# Patient Record
Sex: Female | Born: 1975 | Race: White | Hispanic: No | Marital: Married | Smoking: Never smoker
Health system: Southern US, Community
[De-identification: ages and names within clinical notes are randomized; demographics above are authoritative.]

## PROBLEM LIST (undated history)

## (undated) DIAGNOSIS — G43909 Migraine, unspecified, not intractable, without status migrainosus: Secondary | ICD-10-CM

## (undated) DIAGNOSIS — K589 Irritable bowel syndrome without diarrhea: Secondary | ICD-10-CM

## (undated) HISTORY — PX: OVARIAN CYST REMOVAL: SHX89

## (undated) HISTORY — PX: OTHER SURGICAL HISTORY: SHX169

---

## 2006-11-10 ENCOUNTER — Emergency Department: Payer: Self-pay | Admitting: Emergency Medicine

## 2008-11-26 ENCOUNTER — Emergency Department: Payer: Self-pay | Admitting: Unknown Physician Specialty

## 2011-06-14 ENCOUNTER — Emergency Department: Payer: Self-pay | Admitting: Emergency Medicine

## 2015-02-07 ENCOUNTER — Ambulatory Visit: Admission: EM | Admit: 2015-02-07 | Discharge: 2015-02-07 | Discharge status: Absent without leave | Payer: Self-pay

## 2015-06-20 ENCOUNTER — Other Ambulatory Visit: Payer: Self-pay | Admitting: Obstetrics and Gynecology

## 2015-06-20 DIAGNOSIS — Z1231 Encounter for screening mammogram for malignant neoplasm of breast: Secondary | ICD-10-CM

## 2015-06-29 ENCOUNTER — Ambulatory Visit
Admission: RE | Admit: 2015-06-29 | Discharge: 2015-06-29 | Disposition: A | Discharge status: Home / Self Care | Payer: 59 | Source: Ambulatory Visit | Attending: Obstetrics and Gynecology | Admitting: Obstetrics and Gynecology

## 2015-06-29 DIAGNOSIS — Z1231 Encounter for screening mammogram for malignant neoplasm of breast: Secondary | ICD-10-CM | POA: Insufficient documentation

## 2016-07-01 ENCOUNTER — Other Ambulatory Visit: Payer: Self-pay | Admitting: Obstetrics and Gynecology

## 2016-07-01 DIAGNOSIS — Z1231 Encounter for screening mammogram for malignant neoplasm of breast: Secondary | ICD-10-CM

## 2016-07-15 ENCOUNTER — Ambulatory Visit
Admission: RE | Admit: 2016-07-15 | Discharge: 2016-07-15 | Disposition: A | Discharge status: Home / Self Care | Payer: 59 | Source: Ambulatory Visit | Attending: Obstetrics and Gynecology | Admitting: Obstetrics and Gynecology

## 2016-07-15 DIAGNOSIS — Z1231 Encounter for screening mammogram for malignant neoplasm of breast: Secondary | ICD-10-CM | POA: Diagnosis not present

## 2017-06-02 ENCOUNTER — Encounter: Payer: Self-pay | Admitting: *Deleted

## 2017-06-02 ENCOUNTER — Ambulatory Visit
Admission: EM | Admit: 2017-06-02 | Discharge: 2017-06-02 | Disposition: A | Discharge status: Home / Self Care | Payer: 59 | Attending: Family Medicine | Admitting: Family Medicine

## 2017-06-02 DIAGNOSIS — B9689 Other specified bacterial agents as the cause of diseases classified elsewhere: Secondary | ICD-10-CM

## 2017-06-02 DIAGNOSIS — N390 Urinary tract infection, site not specified: Secondary | ICD-10-CM | POA: Diagnosis not present

## 2017-06-02 DIAGNOSIS — N760 Acute vaginitis: Secondary | ICD-10-CM

## 2017-06-02 DIAGNOSIS — L237 Allergic contact dermatitis due to plants, except food: Secondary | ICD-10-CM

## 2017-06-02 HISTORY — DX: Irritable bowel syndrome, unspecified: K58.9

## 2017-06-02 HISTORY — DX: Migraine, unspecified, not intractable, without status migrainosus: G43.909

## 2017-06-02 LAB — URINALYSIS, COMPLETE (UACMP) WITH MICROSCOPIC
Bilirubin Urine: NEGATIVE
Glucose, UA: NEGATIVE mg/dL
Ketones, ur: NEGATIVE mg/dL
Nitrite: POSITIVE — AB
PH: 7 (ref 5.0–8.0)
SPECIFIC GRAVITY, URINE: 1.02 (ref 1.005–1.030)

## 2017-06-02 LAB — WET PREP, GENITAL
SPERM: NONE SEEN
Trich, Wet Prep: NONE SEEN
YEAST WET PREP: NONE SEEN

## 2017-06-02 LAB — PREGNANCY, URINE: Preg Test, Ur: NEGATIVE

## 2017-06-02 MED ORDER — METRONIDAZOLE 500 MG PO TABS: 500.0000 mg | ORAL_TABLET | Freq: Two times a day (BID) | ORAL | 0 refills | Status: DC

## 2017-06-02 MED ORDER — CEPHALEXIN 500 MG PO CAPS: 500.0000 mg | ORAL_CAPSULE | Freq: Two times a day (BID) | ORAL | 0 refills | Status: DC

## 2017-06-02 MED ORDER — TRIAMCINOLONE ACETONIDE 0.1 % EX CREA: 1.0000 "application " | TOPICAL_CREAM | Freq: Two times a day (BID) | CUTANEOUS | 0 refills | Status: DC

## 2017-06-02 NOTE — ED Provider Notes (Signed)
[facility]    CSN: [id number] Arrival date & time: 06/02/17  1412     History   Chief Complaint Chief Complaint  Patient presents with  . Vaginal Discharge    HPI Amanda Miranda is a 41 y.o. female.   HPI  41 year old female who presents with symptoms of low back pain does not radiate into her flank and nausea for 2 weeks. She states that her urine has been very dark and smelly. She states that she has a history of vaginitis in the past. Whenever her urine smells she has  been found to have  vaginitis. She denies any discharge. She's had no pain. Denies any fever or chills. She denies any urgency frequency or dysuria. She states that last week she had her yearly exam which included chlamydia and gonorrhea which  were negative.  Patient also has lesions on her forearms and one on the left malar region of her face from poison ivy contact. There are linear erythematous and have vesicles present.        Past Medical History:  Diagnosis Date  . IBS (irritable bowel syndrome)   . Migraines     There are no active problems to display for this patient.   Past Surgical History:  Procedure Laterality Date  . neck surgery    . OVARIAN CYST REMOVAL      OB History    No data available       Home Medications    Prior to Admission medications   Medication Sig Start Date End Date Taking? Authorizing Provider  Butalbital-ASA-Caff-Codeine (BUTAL-ASA-CAFF-COD PO) Take by mouth.   Yes [provider]  cyclobenzaprine (FLEXERIL) 10 MG tablet Take 10 mg by mouth 3 (three) times daily as needed for muscle spasms.   Yes [provider]  norethindrone-ethinyl estradiol-iron (ESTROSTEP FE,TILIA FE,TRI-LEGEST FE) 1-20/1-30/1-35 MG-MCG tablet Take 1 tablet by mouth daily.   Yes [provider]  promethazine (PHENERGAN) 25 MG tablet Take 25 mg by mouth every 6 (six) hours as needed for nausea or vomiting.   Yes [provider]  rizatriptan  (MAXALT) 5 MG tablet Take 5 mg by mouth as needed for migraine. May repeat in 2 hours if needed   Yes [provider]  cephALEXin (KEFLEX) 500 MG capsule Take 1 capsule (500 mg total) by mouth 2 (two) times daily. 06/02/17   Lorin Picket, PA-C  metroNIDAZOLE (FLAGYL) 500 MG tablet Take 1 tablet (500 mg total) by mouth 2 (two) times daily. 06/02/17   Lorin Picket, PA-C  triamcinolone cream (KENALOG) 0.1 % Apply 1 application topically 2 (two) times daily. 06/02/17   Lorin Picket, PA-C    Family History Family History  Problem Relation Age of Onset  . Hypertension Mother   . COPD Father   . Breast cancer Neg Hx     Social History Social History  Substance Use Topics  . Smoking status: Never Smoker  . Smokeless tobacco: Never Used  . Alcohol use Yes     Allergies   Patient has no known allergies.   Review of Systems Review of Systems  Constitutional: Positive for activity change. Negative for appetite change, chills, fatigue and fever.  Genitourinary: Negative for flank pain, genital sores, hematuria, vaginal discharge and vaginal pain.  All other systems reviewed and are negative.    Physical Exam Triage Vital Signs ED Triage Vitals  Enc Vitals Group     BP 06/02/17 1441 114/79  Pulse Rate 06/02/17 1441 72     Resp 06/02/17 1441 16     Temp 06/02/17 1441 98.9 F (37.2 C)     Temp Source 06/02/17 1441 Oral     SpO2 06/02/17 1441 100 %     Weight 06/02/17 1441 124 lb (56.2 kg)     Height 06/02/17 1441 5\' 6"  (1.676 m)     Head Circumference --      Peak Flow --      Pain Score 06/02/17 1442 0     Pain Loc --      Pain Edu? --      Excl. in [place]? --    No data found.   Updated Vital Signs BP 114/79 (BP Location: Left Arm)   Pulse 72   Temp 98.9 F (37.2 C) (Oral)   Resp 16   Ht 5\' 6"  (1.676 m)   Wt 124 lb (56.2 kg)   LMP 04/16/2017   SpO2 100%   BMI 20.01 kg/m   Visual Acuity Right Eye Distance:   Left Eye Distance:   Bilateral  Distance:    Right Eye Near:   Left Eye Near:    Bilateral Near:     Physical Exam  Constitutional: She is oriented to person, place, and time. She appears well-developed and well-nourished. No distress.  HENT:  Head: Normocephalic.  Eyes: Pupils are equal, round, and reactive to light. Right eye exhibits no discharge. Left eye exhibits no discharge.  Neck: Normal range of motion.  Pulmonary/Chest: Effort normal and breath sounds normal.  Abdominal: Soft. Bowel sounds are normal.  No significant CVA tenderness.  Genitourinary: Vaginal discharge found.  Genitourinary Comments: Pelvic exam was performed with Myrna, CMA chaperone and assisted. External genitalia shows no discharge no lesions no irritation seen. Speculum exam was then performed showing an adherent whitish discharge over the walls of the vagina. A positive whiff test. Samples were taken and specimen to laboratory for analysis. Speculum was removed. Bimanual was then performed. Patient has adnexal tenderness on the right and less so on the left. There is no cervical motion tenderness present.  Musculoskeletal: She exhibits tenderness.  Neurological: She is alert and oriented to person, place, and time.  Skin: Skin is warm and dry. She is not diaphoretic.  Psychiatric: She has a normal mood and affect. Her behavior is normal. Judgment and thought content normal.  Nursing note and vitals reviewed.    UC Treatments / Results  Labs (all labs ordered are listed, but only abnormal results are displayed) Labs Reviewed  WET PREP, GENITAL - Abnormal; Notable for the following:       Result Value   Clue Cells Wet Prep HPF POC PRESENT (*)    WBC, Wet Prep HPF POC FEW (*)    All other components within normal limits  URINALYSIS, COMPLETE (UACMP) WITH MICROSCOPIC - Abnormal; Notable for the following:    APPearance HAZY (*)    Hgb urine dipstick SMALL (*)    Protein, ur TRACE (*)    Nitrite POSITIVE (*)    Leukocytes, UA TRACE  (*)    Squamous Epithelial / LPF 0-5 (*)    Bacteria, UA FEW (*)    All other components within normal limits  URINE CULTURE  PREGNANCY, URINE    EKG  EKG Interpretation None       Radiology No results found.  Procedures Procedures (including critical care time)  Medications Ordered in UC Medications - No data to display  Initial Impression / Assessment and Plan / UC Course  I have reviewed the triage vital signs and the nursing notes.  Pertinent labs & imaging results that were available during my care of the patient were reviewed by me and considered in my medical decision making (see chart for details).     Plan: 1. Test/x-ray results and diagnosis reviewed with patient 2. rx as per orders; risks, benefits, potential side effects reviewed with patient 3. Recommend supportive treatment with no  alcohol use while taking the Flagyl. We'll treat for a suspected UTI and culture urine for confirmation. For the rash on her arms that are poison ivy I will provide her with triamcinolone cream. She is one small area over her left malar region of her face that she will use also.Recommended that she follow-up with her GYN due to her right adnexal tenderness and her back pain. 4. F/u prn if symptoms worsen or don't improve   Final Clinical Impressions(s) / UC Diagnoses   Final diagnoses:  BV (bacterial vaginosis)  Lower urinary tract infectious disease  Poison ivy dermatitis    New Prescriptions Discharge Medication List as of 06/02/2017  4:15 PM    START taking these medications   Details  cephALEXin (KEFLEX) 500 MG capsule Take 1 capsule (500 mg total) by mouth 2 (two) times daily., Starting Mon 06/02/2017, Normal    metroNIDAZOLE (FLAGYL) 500 MG tablet Take 1 tablet (500 mg total) by mouth 2 (two) times daily., Starting Mon 06/02/2017, Normal    triamcinolone cream (KENALOG) 0.1 % Apply 1 application topically 2 (two) times daily., Starting Mon 06/02/2017, Normal           Controlled Substance Prescriptions  Controlled Substance Registry consulted? Not Applicable   Lorin Picket, PA-C 06/02/17 2120

## 2017-06-02 NOTE — ED Triage Notes (Signed)
Patient started having symptoms of vaginal discharge / discomfort, lower back pain, and nausea 2 weeks ago. Patient has a history of vaginitis.

## 2017-06-05 LAB — URINE CULTURE: Culture: >=100000 — AB

## 2017-06-06 ENCOUNTER — Telehealth: Payer: Self-pay | Admitting: Emergency Medicine

## 2017-06-06 NOTE — Telephone Encounter (Signed)
Called patient regarding positive Urine culture results. Results communicated, sensitivities discussed. Patient voiced understanding of need to finish antibiotic and follow-up if symptoms do not continue to improve. nmw

## 2017-06-11 DIAGNOSIS — G43909 Migraine, unspecified, not intractable, without status migrainosus: Secondary | ICD-10-CM | POA: Insufficient documentation

## 2017-07-02 ENCOUNTER — Ambulatory Visit
Admission: EM | Admit: 2017-07-02 | Discharge: 2017-07-02 | Disposition: A | Discharge status: Home / Self Care | Payer: 59 | Attending: Family Medicine | Admitting: Family Medicine

## 2017-07-02 DIAGNOSIS — N898 Other specified noninflammatory disorders of vagina: Secondary | ICD-10-CM

## 2017-07-02 DIAGNOSIS — R319 Hematuria, unspecified: Secondary | ICD-10-CM

## 2017-07-02 DIAGNOSIS — R300 Dysuria: Secondary | ICD-10-CM

## 2017-07-02 DIAGNOSIS — N390 Urinary tract infection, site not specified: Secondary | ICD-10-CM

## 2017-07-02 DIAGNOSIS — B373 Candidiasis of vulva and vagina: Secondary | ICD-10-CM | POA: Diagnosis not present

## 2017-07-02 DIAGNOSIS — Z3202 Encounter for pregnancy test, result negative: Secondary | ICD-10-CM | POA: Diagnosis not present

## 2017-07-02 DIAGNOSIS — B3731 Acute candidiasis of vulva and vagina: Secondary | ICD-10-CM

## 2017-07-02 LAB — URINALYSIS, COMPLETE (UACMP) WITH MICROSCOPIC
Bilirubin Urine: NEGATIVE
Glucose, UA: NEGATIVE mg/dL
Ketones, ur: NEGATIVE mg/dL
Nitrite: POSITIVE — AB
PH: 7 (ref 5.0–8.0)
Protein, ur: NEGATIVE mg/dL
SPECIFIC GRAVITY, URINE: 1.015 (ref 1.005–1.030)

## 2017-07-02 LAB — WET PREP, GENITAL
Clue Cells Wet Prep HPF POC: NONE SEEN
SPERM: NONE SEEN
TRICH WET PREP: NONE SEEN

## 2017-07-02 LAB — PREGNANCY, URINE: Preg Test, Ur: NEGATIVE

## 2017-07-02 MED ORDER — FLUCONAZOLE 150 MG PO TABS: 150.0000 mg | ORAL_TABLET | Freq: Every day | ORAL | 0 refills | Status: DC

## 2017-07-02 MED ORDER — CIPROFLOXACIN HCL 500 MG PO TABS: 500.0000 mg | ORAL_TABLET | Freq: Two times a day (BID) | ORAL | 0 refills | Status: DC

## 2017-07-02 NOTE — ED Provider Notes (Signed)
[facility]    CSN: [id number] Arrival date & time: 07/02/17  1726     History   Chief Complaint Chief Complaint  Patient presents with  . Dysuria    HPI Amanda Miranda is a 41 y.o. female.    Dysuria  Pain quality:  Burning Pain severity:  Mild Onset quality:  Sudden Duration:  1 week Timing:  Constant Progression:  Worsening Chronicity:  New Recent urinary tract infections: yes   Relieved by:  Nothing Ineffective treatments:  Antibiotics Urinary symptoms: foul-smelling urine and frequent urination   Urinary symptoms: no hematuria and no bladder incontinence   Associated symptoms: nausea and vaginal discharge   Associated symptoms: no abdominal pain, no fever, no flank pain, no genital lesions and no vomiting   Risk factors: sexually active   Risk factors: no hx of pyelonephritis, no hx of urolithiasis, no kidney transplant, not pregnant, no recurrent urinary tract infections, no renal cysts, no renal disease, no sexually transmitted infections, no single kidney and no urinary catheter     Past Medical History:  Diagnosis Date  . IBS (irritable bowel syndrome)   . Migraines     There are no active problems to display for this patient.   Past Surgical History:  Procedure Laterality Date  . neck surgery    . OVARIAN CYST REMOVAL      OB History    No data available       Home Medications    Prior to Admission medications   Medication Sig Start Date End Date Taking? Authorizing Provider  Butalbital-ASA-Caff-Codeine (BUTAL-ASA-CAFF-COD PO) Take by mouth.   Yes [provider]  cyclobenzaprine (FLEXERIL) 10 MG tablet Take 10 mg by mouth 3 (three) times daily as needed for muscle spasms.   Yes [provider]  norethindrone-ethinyl estradiol-iron (ESTROSTEP FE,TILIA FE,TRI-LEGEST FE) 1-20/1-30/1-35 MG-MCG tablet Take 1 tablet by mouth daily.   Yes [provider]  promethazine (PHENERGAN) 25 MG tablet Take 25 mg by  mouth every 6 (six) hours as needed for nausea or vomiting.   Yes [provider]  rizatriptan (MAXALT) 5 MG tablet Take 5 mg by mouth as needed for migraine. May repeat in 2 hours if needed   Yes [provider]  triamcinolone cream (KENALOG) 0.1 % Apply 1 application topically 2 (two) times daily. 06/02/17  Yes Lorin Picket, PA-C  cephALEXin (KEFLEX) 500 MG capsule Take 1 capsule (500 mg total) by mouth 2 (two) times daily. 06/02/17   Lorin Picket, PA-C  ciprofloxacin (CIPRO) 500 MG tablet Take 1 tablet (500 mg total) by mouth every 12 (twelve) hours. 07/02/17   Norval Gable, MD  fluconazole (DIFLUCAN) 150 MG tablet Take 1 tablet (150 mg total) by mouth daily. 07/02/17   Norval Gable, MD  metroNIDAZOLE (FLAGYL) 500 MG tablet Take 1 tablet (500 mg total) by mouth 2 (two) times daily. 06/02/17   Lorin Picket, PA-C    Family History Family History  Problem Relation Age of Onset  . Hypertension Mother   . COPD Father   . Breast cancer Neg Hx     Social History Social History  Substance Use Topics  . Smoking status: Never Smoker  . Smokeless tobacco: Never Used  . Alcohol use Yes     Allergies   Patient has no known allergies.   Review of Systems Review of Systems  Constitutional: Negative for fever.  Gastrointestinal: Positive for nausea. Negative for abdominal pain and vomiting.  Genitourinary:  Positive for dysuria and vaginal discharge. Negative for flank pain.     Physical Exam Triage Vital Signs ED Triage Vitals  Enc Vitals Group     BP 07/02/17 1739 118/65     Pulse Rate 07/02/17 1739 73     Resp 07/02/17 1739 18     Temp 07/02/17 1739 98.2 F (36.8 C)     Temp Source 07/02/17 1739 Oral     SpO2 07/02/17 1739 100 %     Weight 07/02/17 1738 124 lb (56.2 kg)     Height 07/02/17 1738 5\' 6"  (1.676 m)     Head Circumference --      Peak Flow --      Pain Score 07/02/17 1738 6     Pain Loc --      Pain Edu? --      Excl. in [place]? --     No data found.   Updated Vital Signs BP 118/65 (BP Location: Left Arm)   Pulse 73   Temp 98.2 F (36.8 C) (Oral)   Resp 18   Ht 5\' 6"  (1.676 m)   Wt 124 lb (56.2 kg)   SpO2 100%   BMI 20.01 kg/m   Visual Acuity Right Eye Distance:   Left Eye Distance:   Bilateral Distance:    Right Eye Near:   Left Eye Near:    Bilateral Near:     Physical Exam  Constitutional: She appears well-developed and well-nourished. No distress.  Abdominal: Soft. Bowel sounds are normal. She exhibits no distension and no mass. There is tenderness (mild, suprapubic). There is no rebound and no guarding.  Skin: She is not diaphoretic.  Nursing note and vitals reviewed.    UC Treatments / Results  Labs (all labs ordered are listed, but only abnormal results are displayed) Labs Reviewed  WET PREP, GENITAL - Abnormal; Notable for the following:       Result Value   Yeast Wet Prep HPF POC PRESENT (*)    WBC, Wet Prep HPF POC FEW (*)    All other components within normal limits  URINALYSIS, COMPLETE (UACMP) WITH MICROSCOPIC - Abnormal; Notable for the following:    APPearance CLOUDY (*)    Hgb urine dipstick TRACE (*)    Nitrite POSITIVE (*)    Leukocytes, UA SMALL (*)    Squamous Epithelial / LPF 0-5 (*)    Bacteria, UA FEW (*)    All other components within normal limits  URINE CULTURE  PREGNANCY, URINE    EKG  EKG Interpretation None       Radiology No results found.  Procedures Procedures (including critical care time)  Medications Ordered in UC Medications - No data to display   Initial Impression / Assessment and Plan / UC Course  I have reviewed the triage vital signs and the nursing notes.  Pertinent labs & imaging results that were available during my care of the patient were reviewed by me and considered in my medical decision making (see chart for details).       Final Clinical Impressions(s) / UC Diagnoses   Final diagnoses:  Urinary tract infection  with hematuria, site unspecified  Yeast vaginitis    New Prescriptions Discharge Medication List as of 07/02/2017  6:37 PM    START taking these medications   Details  ciprofloxacin (CIPRO) 500 MG tablet Take 1 tablet (500 mg total) by mouth every 12 (twelve) hours., Starting Wed 07/02/2017, Normal    fluconazole (DIFLUCAN) 150  MG tablet Take 1 tablet (150 mg total) by mouth daily., Starting Wed 07/02/2017, Normal       1. Lab results and diagnosis reviewed with patient 2. rx as per orders above; reviewed possible side effects, interactions, risks and benefits  3. Recommend supportive treatment with increase water intake; otc analgesics 4. Follow-up prn if symptoms worsen or don't improve  Controlled Substance Prescriptions [place] Controlled Substance Registry consulted? Not Applicable   Norval Gable, MD 07/02/17 [phone]

## 2017-07-02 NOTE — ED Triage Notes (Signed)
Patient complains of urinary urgency, frequency and burning with urination. Patient was seen here one month ago and was placed on keflex. Patient states that symptoms never fully went away. Patient reports that symptoms worsened again recently.

## 2017-07-05 LAB — URINE CULTURE

## 2017-10-31 ENCOUNTER — Encounter: Payer: Self-pay | Admitting: Emergency Medicine

## 2017-10-31 ENCOUNTER — Other Ambulatory Visit: Payer: Self-pay

## 2017-10-31 ENCOUNTER — Emergency Department
Admission: EM | Admit: 2017-10-31 | Discharge: 2017-10-31 | Disposition: A | Discharge status: Home / Self Care | Payer: Managed Care, Other (non HMO) | Attending: Emergency Medicine | Admitting: Emergency Medicine

## 2017-10-31 DIAGNOSIS — T783XXA Angioneurotic edema, initial encounter: Secondary | ICD-10-CM | POA: Diagnosis not present

## 2017-10-31 DIAGNOSIS — Z79899 Other long term (current) drug therapy: Secondary | ICD-10-CM | POA: Diagnosis not present

## 2017-10-31 DIAGNOSIS — R600 Localized edema: Secondary | ICD-10-CM | POA: Diagnosis present

## 2017-10-31 DIAGNOSIS — T7840XA Allergy, unspecified, initial encounter: Secondary | ICD-10-CM

## 2017-10-31 LAB — CBC
HCT: 44.5 % (ref 35.0–47.0)
Hemoglobin: 15.2 g/dL (ref 12.0–16.0)
MCH: 33 pg (ref 26.0–34.0)
MCHC: 34.1 g/dL (ref 32.0–36.0)
MCV: 96.7 fL (ref 80.0–100.0)
PLATELETS: 146 10*3/uL — AB (ref 150–440)
RBC: 4.61 MIL/uL (ref 3.80–5.20)
RDW: 12.3 % (ref 11.5–14.5)
WBC: 4.5 10*3/uL (ref 3.6–11.0)

## 2017-10-31 LAB — BASIC METABOLIC PANEL
Anion gap: 9 (ref 5–15)
BUN: 12 mg/dL (ref 6–20)
CALCIUM: 9.2 mg/dL (ref 8.9–10.3)
CO2: 21 mmol/L — ABNORMAL LOW (ref 22–32)
Chloride: 108 mmol/L (ref 101–111)
Creatinine, Ser: 0.6 mg/dL (ref 0.44–1.00)
GFR calc Af Amer: >60 mL/min (ref >60)
Glucose, Bld: 90 mg/dL (ref 65–99)
Potassium: 4.8 mmol/L (ref 3.5–5.1)
SODIUM: 138 mmol/L (ref 135–145)

## 2017-10-31 MED ORDER — PREDNISONE 10 MG PO TABS: ORAL_TABLET | ORAL | 0 refills | Status: DC

## 2017-10-31 MED ORDER — AMOXICILLIN 500 MG PO CAPS: 500.0000 mg | ORAL_CAPSULE | Freq: Three times a day (TID) | ORAL | 0 refills | Status: DC

## 2017-10-31 MED ORDER — EPINEPHRINE 0.3 MG/0.3ML IJ SOAJ: 0.3000 mg | Freq: Once | INTRAMUSCULAR | 1 refills | Status: AC

## 2017-10-31 MED ORDER — FAMOTIDINE IN NACL 20-0.9 MG/50ML-% IV SOLN
20.0000 mg | Freq: Once | INTRAVENOUS | Status: AC
  Administered 2017-10-31: 20 mg via INTRAVENOUS
  Filled 2017-10-31: qty 50

## 2017-10-31 MED ORDER — DIPHENHYDRAMINE HCL 50 MG/ML IJ SOLN
25.0000 mg | Freq: Once | INTRAMUSCULAR | Status: AC
  Administered 2017-10-31: 25 mg via INTRAVENOUS
  Filled 2017-10-31: qty 1

## 2017-10-31 MED ORDER — RANITIDINE HCL 150 MG PO TABS: 150.0000 mg | ORAL_TABLET | Freq: Two times a day (BID) | ORAL | 0 refills | Status: DC

## 2017-10-31 MED ORDER — METHYLPREDNISOLONE SODIUM SUCC 125 MG IJ SOLR
125.0000 mg | Freq: Once | INTRAMUSCULAR | Status: AC
  Administered 2017-10-31: 125 mg via INTRAVENOUS
  Filled 2017-10-31: qty 2

## 2017-10-31 MED ORDER — DIPHENHYDRAMINE HCL 25 MG PO CAPS: 25.0000 mg | ORAL_CAPSULE | ORAL | 0 refills | Status: DC | PRN

## 2017-10-31 NOTE — Discharge Instructions (Addendum)
Return to the ED immediately for throat swelling or SOB. Continue taking Benadryl every 4-6 hours. Take amoxicillin, ranididine tonight. Begin steroids tomorrow. Follow up with primary care, allergy, and dentist next week.

## 2017-10-31 NOTE — ED Triage Notes (Signed)
Pt to ed with c/o facial swelling to mouth and jaws.  Pt denies sob, denies difficulty breathing.  Pt states she has an ulcer in mouth at this time.

## 2017-10-31 NOTE — ED Notes (Signed)
Pt given diet shasta and graham crackers

## 2017-10-31 NOTE — ED Notes (Signed)
See triage note  Presents with swelling to both jaw areas  States she woke up with this swelling this am and also swelling noted to lips  Denies any new meds or foods

## 2017-10-31 NOTE — ED Provider Notes (Signed)
[facility] Emergency Department Provider Note  ____________________________________________  Time seen: Approximately 11:08 AM  I have reviewed the triage vital signs and the nursing notes.   HISTORY  Chief Complaint Facial Swelling    HPI Amanda Miranda is a 42 y.o. female that presents to the emergency department for evaluation of right cheek and lip swelling and itchy rash since this morning.  Patient has frequent mouth ulcers and has one on the right side of her cheek.  Patient called her dentist this morning and asked if the ulcer could cause swelling and was told to come to the ER.  She took ibuprofen yesterday.  She has one more day left of  a course of antibiotics for a UTI.  The only other medication she occasionally takes is Maxalt for migraines.   She has no known allergies.  She sees the dentist twice a year.  No fever, throat tightening, dental pain, shortness of breath, chest pain, nausea, vomiting, abdominal pain, dysuria, urgency, frequency.   Past Medical History:  Diagnosis Date  . IBS (irritable bowel syndrome)   . Migraines     There are no active problems to display for this patient.   Past Surgical History:  Procedure Laterality Date  . neck surgery    . OVARIAN CYST REMOVAL      Prior to Admission medications   Medication Sig Start Date End Date Taking? Authorizing Provider  nitrofurantoin (MACRODANTIN) 100 MG capsule Take 100 mg by mouth 4 (four) times daily.   Yes [provider]  amoxicillin (AMOXIL) 500 MG capsule Take 1 capsule (500 mg total) by mouth 3 (three) times daily. 10/31/17   Laban Emperor, PA-C  Butalbital-ASA-Caff-Codeine (BUTAL-ASA-CAFF-COD PO) Take by mouth.    [provider]  cephALEXin (KEFLEX) 500 MG capsule Take 1 capsule (500 mg total) by mouth 2 (two) times daily. 06/02/17   Lorin Picket, PA-C  ciprofloxacin (CIPRO) 500 MG tablet Take 1 tablet (500 mg total) by mouth every 12 (twelve)  hours. 07/02/17   Norval Gable, MD  cyclobenzaprine (FLEXERIL) 10 MG tablet Take 10 mg by mouth 3 (three) times daily as needed for muscle spasms.    [provider]  diphenhydrAMINE (BENADRYL) 25 mg capsule Take 1 capsule (25 mg total) by mouth every 4 (four) hours as needed. 10/31/17 10/31/18  Laban Emperor, PA-C  EPINEPHrine 0.3 mg/0.3 mL IJ SOAJ injection Inject 0.3 mLs (0.3 mg total) into the muscle once for 1 dose. 10/31/17 10/31/17  Laban Emperor, PA-C  fluconazole (DIFLUCAN) 150 MG tablet Take 1 tablet (150 mg total) by mouth daily. 07/02/17   Norval Gable, MD  metroNIDAZOLE (FLAGYL) 500 MG tablet Take 1 tablet (500 mg total) by mouth 2 (two) times daily. 06/02/17   Lorin Picket, PA-C  norethindrone-ethinyl estradiol-iron (ESTROSTEP FE,TILIA FE,TRI-LEGEST FE) 1-20/1-30/1-35 MG-MCG tablet Take 1 tablet by mouth daily.    [provider]  predniSONE (DELTASONE) 10 MG tablet Take 6 tablets on day 1, take 5 tablets on day 2, take 4 tablets on day 3, take 3 tablets on day 4, take 2 tablets on day 5, take 1 tablet on day 6 10/31/17   Laban Emperor, PA-C  promethazine (PHENERGAN) 25 MG tablet Take 25 mg by mouth every 6 (six) hours as needed for nausea or vomiting.    [provider]  ranitidine (ZANTAC) 150 MG tablet Take 1 tablet (150 mg total) by mouth 2 (two) times daily. 10/31/17 10/31/18  Laban Emperor, PA-C  rizatriptan (MAXALT) 5 MG tablet Take 5 mg by mouth as needed for migraine. May repeat in 2 hours if needed    [provider]  triamcinolone cream (KENALOG) 0.1 % Apply 1 application topically 2 (two) times daily. 06/02/17   Lorin Picket, PA-C    Allergies Patient has no known allergies.  Family History  Problem Relation Age of Onset  . Hypertension Mother   . COPD Father   . Breast cancer Neg Hx     Social History Social History   Tobacco Use  . Smoking status: Never Smoker  . Smokeless tobacco: Never Used  Substance Use Topics   . Alcohol use: Yes  . Drug use: No     Review of Systems  Constitutional: No fever/chills Cardiovascular: No chest pain. Respiratory: No SOB. Gastrointestinal: No abdominal pain.  No nausea, no vomiting.  Musculoskeletal: Negative for musculoskeletal pain. Skin: Negative for abrasions, lacerations, ecchymosis.   ____________________________________________   PHYSICAL EXAM:  VITAL SIGNS: ED Triage Vitals  Enc Vitals Group     BP 10/31/17 0839 124/87     Pulse Rate 10/31/17 0839 80     Resp 10/31/17 0839 20     Temp 10/31/17 0839 98.7 F (37.1 C)     Temp Source 10/31/17 0839 Oral     SpO2 10/31/17 0839 100 %     Weight 10/31/17 0840 125 lb (56.7 kg)     Height 10/31/17 0840 5\' 6"  (1.676 m)     Head Circumference --      Peak Flow --      Pain Score 10/31/17 0844 3     Pain Loc --      Pain Edu? --      Excl. in [place]? --      Constitutional: Alert and oriented. Well appearing and in no acute distress. Eyes: Conjunctivae are normal. PERRL. EOMI. Head: Atraumatic. ENT:      Ears:      Nose: No congestion/rhinnorhea.      Mouth/Throat: Mucous membranes are moist.  Swelling to right cheek and top lip.  1 mm ulcer to inside of right cheek.  Filling in bottom right molar.  Cavity and top right molar.  No pain around teeth.  No submandibular swelling. No palpable abscess.  Neck: No stridor.  Cardiovascular: Normal rate, regular rhythm.  Good peripheral circulation. Respiratory: Normal respiratory effort without tachypnea or retractions. Lungs CTAB. Good air entry to the bases with no decreased or absent breath sounds. Gastrointestinal: Bowel sounds 4 quadrants. Soft and nontender to palpation. No guarding or rigidity. No palpable masses. No distention.  Musculoskeletal: Full range of motion to all extremities. No gross deformities appreciated. Neurologic:  Normal speech and language. No gross focal neurologic deficits are appreciated.  Skin:  Skin is warm, dry and intact.   Hives to chest, abdomen, arms.   ____________________________________________   LABS (all labs ordered are listed, but only abnormal results are displayed)  Labs Reviewed  CBC - Abnormal; Notable for the following components:      Result Value   Platelets 146 (*)    All other components within normal limits  BASIC METABOLIC PANEL - Abnormal; Notable for the following components:   CO2 21 (*)    All other components within normal limits   ____________________________________________  EKG   ____________________________________________  RADIOLOGY   No results found.  ____________________________________________    PROCEDURES  Procedure(s) performed:    Procedures    Medications  methylPREDNISolone sodium  succinate (SOLU-MEDROL) 125 mg/2 mL injection 125 mg (125 mg Intravenous Given 10/31/17 1122)  diphenhydrAMINE (BENADRYL) injection 25 mg (25 mg Intravenous Given 10/31/17 1123)  famotidine (PEPCID) IVPB 20 mg premix (0 mg Intravenous Stopped 10/31/17 1349)     ____________________________________________   INITIAL IMPRESSION / ASSESSMENT AND PLAN / ED COURSE  Pertinent labs & imaging results that were available during my care of the patient were reviewed by me and considered in my medical decision making (see chart for details).  Review of the [place] CSRS was performed in accordance of the [place] prior to dispensing any controlled drugs.   Patient presented to the emergency department for evaluation of right cheek swelling and hives since this morning.  Symptoms are consistent with angioedema and allergic reaction.  Angioedema is likely allergic since patient also has hives.  She is unsure what she is allergic to.  She has 1 more day left of a course of antibiotics.  We discussed that this is unlikely the cause of her reaction but she will not take the last day of medication.  In the 5 hours that she has been in the emergency department she has not felt any throat  tightening or shortness of breath.  She was given IM Solu-Medrol, Benadryl, Pepcid.  She will be given a prescription for prednisone, Benadryl, ranitidine.  She will also be given a prescription for an EpiPen.  Pharmacies were called and was confirmed that epi pens are available at [facility].  Patient will take her prescription there.  She will also be given a course of amoxicillin since swelling is over ulcer to cover for bacterial infection.  Patient will be discharged home with prescriptions for prednisone, Benadryl, ranitidine, EpiPen, amoxicillin. Patient is to follow up with dentist, allergy, PCP, ER as directed. Patient is given ED precautions to return to the ED for any worsening or new symptoms.     ____________________________________________  FINAL CLINICAL IMPRESSION(S) / ED DIAGNOSES  Final diagnoses:  Angioedema, initial encounter  Allergic reaction, initial encounter      NEW MEDICATIONS STARTED DURING THIS VISIT:  ED Discharge Orders        Ordered    EPINEPHrine 0.3 mg/0.3 mL IJ SOAJ injection   Once     10/31/17 1313    predniSONE (DELTASONE) 10 MG tablet     10/31/17 1313    amoxicillin (AMOXIL) 500 MG capsule  3 times daily     10/31/17 1313    diphenhydrAMINE (BENADRYL) 25 mg capsule  Every 4 hours PRN     10/31/17 1313    ranitidine (ZANTAC) 150 MG tablet  2 times daily     10/31/17 1313          This chart was dictated using voice recognition software/Dragon. Despite best efforts to proofread, errors can occur which can change the meaning. Any change was purely unintentional.    Laban Emperor, PA-C 10/31/17 1544    Delman Kitten, MD 10/31/17 [phone]

## 2018-03-25 ENCOUNTER — Ambulatory Visit
Admission: EM | Admit: 2018-03-25 | Discharge: 2018-03-25 | Disposition: A | Discharge status: Home / Self Care | Payer: Managed Care, Other (non HMO) | Attending: Family Medicine | Admitting: Family Medicine

## 2018-03-25 ENCOUNTER — Other Ambulatory Visit: Payer: Self-pay

## 2018-03-25 ENCOUNTER — Encounter: Payer: Self-pay | Admitting: Emergency Medicine

## 2018-03-25 DIAGNOSIS — R05 Cough: Secondary | ICD-10-CM | POA: Diagnosis not present

## 2018-03-25 DIAGNOSIS — K120 Recurrent oral aphthae: Secondary | ICD-10-CM | POA: Diagnosis not present

## 2018-03-25 DIAGNOSIS — R0981 Nasal congestion: Secondary | ICD-10-CM

## 2018-03-25 DIAGNOSIS — J069 Acute upper respiratory infection, unspecified: Secondary | ICD-10-CM | POA: Diagnosis not present

## 2018-03-25 DIAGNOSIS — H9209 Otalgia, unspecified ear: Secondary | ICD-10-CM | POA: Diagnosis not present

## 2018-03-25 MED ORDER — SULFURIC ACID-SULF PHENOLICS 30-50 % MT SOLN: OROMUCOSAL | 0 refills | Status: DC

## 2018-03-25 MED ORDER — PREDNISONE 50 MG PO TABS: ORAL_TABLET | ORAL | 0 refills | Status: DC

## 2018-03-25 MED ORDER — IPRATROPIUM BROMIDE 0.06 % NA SOLN: 2.0000 | Freq: Four times a day (QID) | NASAL | 0 refills | Status: DC | PRN

## 2018-03-25 NOTE — ED Triage Notes (Signed)
Patient in today c/o 4 day history of nasal congestion, bilateral ear pain, bilateral jaw pain, headache and cough. Patient has felt feverish, but hasn't taken her temperature. Patient has tried OTC Motrin and Sudafed without relief.

## 2018-03-25 NOTE — Discharge Instructions (Signed)
Medications as directed.  Take care  Dr. Sharmayne Jablon  

## 2018-03-25 NOTE — ED Provider Notes (Signed)
[facility]    CSN: [id number] Arrival date & time: 03/25/18  1258  History   Chief Complaint Chief Complaint  Patient presents with  . Nasal Congestion  . Otalgia   HPI  42 year old female presents with upper respiratory symptoms.  Patient reports that she has been sick for the past 4 days.  She reports nasal congestion, ear pain, jaw pain, headache, cough.  She has felt feverish but has not taken her temperature.  Additionally, she reports a canker sore underneath her tongue.  She has tried Motrin and Sudafed without resolution.  No known exacerbating factors.  No reported sick contacts.  No other associated symptoms.  No other complaints or concerns this time.  Past Medical History:  Diagnosis Date  . IBS (irritable bowel syndrome)   . Migraines    Past Surgical History:  Procedure Laterality Date  . neck surgery    . OVARIAN CYST REMOVAL      OB History   None      Home Medications    Prior to Admission medications   Medication Sig Start Date End Date Taking? Authorizing Provider  Erenumab-aooe (AIMOVIG) 140 MG/ML SOAJ Inject 1 Dose into the skin every 30 (thirty) days.   Yes [provider]  levonorgestrel (MIRENA, 52 MG,) 20 MCG/24HR IUD Mirena 20 mcg/24 hours (5 yrs) 52 mg intrauterine device  Take by intrauterine route.   Yes [provider]  ipratropium (ATROVENT) 0.06 % nasal spray Place 2 sprays into both nostrils 4 (four) times daily as needed for rhinitis. 03/25/18   Coral Spikes, DO  predniSONE (DELTASONE) 50 MG tablet 1 tablet daily x 5 days. 03/25/18   Coral Spikes, DO  Sulfuric Acid-Sulf Phenolics (DEBACTEROL) 25-63 % SOLN Once as needed per package instructions. 03/25/18   Coral Spikes, DO    Family History Family History  Problem Relation Age of Onset  . Hypertension Mother   . COPD Father   . Breast cancer Neg Hx     Social History Social History   Tobacco Use  . Smoking status: Never Smoker  . Smokeless  tobacco: Never Used  Substance Use Topics  . Alcohol use: Yes    Comment: rarely  . Drug use: No     Allergies   Secnidazole; Sumatriptan succinate; and Zolmitriptan   Review of Systems Review of Systems  Constitutional: Negative for fever.  HENT: Positive for congestion and ear pain.   Respiratory: Positive for cough.   Musculoskeletal:       Body aches.   Physical Exam Triage Vital Signs ED Triage Vitals [03/25/18 1309]  Enc Vitals Group     BP 123/79     Pulse Rate 74     Resp 16     Temp 98.3 F (36.8 C)     Temp Source Oral     SpO2 100 %     Weight 124 lb (56.2 kg)     Height 5\' 6"  (1.676 m)     Head Circumference      Peak Flow      Pain Score 5     Pain Loc      Pain Edu?      Excl. in [place]?    Updated Vital Signs BP 123/79 (BP Location: Right Arm)   Pulse 74   Temp 98.3 F (36.8 C) (Oral)   Resp 16   Ht 5\' 6"  (1.676 m)   Wt 124 lb (56.2 kg)   SpO2  100%   BMI 20.01 kg/m   Visual Acuity Right Eye Distance:   Left Eye Distance:   Bilateral Distance:    Right Eye Near:   Left Eye Near:    Bilateral Near:     Physical Exam  Constitutional: She is oriented to person, place, and time. She appears well-developed. No distress.  HENT:  Head: Normocephalic and atraumatic.  Mouth/Throat: Oropharynx is clear and moist.  Patient with a small canker sore underneath the tongue. Normal TMs.  Eyes: Conjunctivae are normal. Right eye exhibits no discharge. Left eye exhibits no discharge.  Neck: Neck supple.  Cardiovascular: Normal rate and regular rhythm.  Pulmonary/Chest: Effort normal and breath sounds normal.  Lymphadenopathy:    She has no cervical adenopathy.  Neurological: She is alert and oriented to person, place, and time.  Psychiatric: She has a normal mood and affect. Her behavior is normal.  Nursing note and vitals reviewed.  UC Treatments / Results  Labs (all labs ordered are listed, but only abnormal results are displayed) Labs  Reviewed - No data to display  EKG None  Radiology No results found.  Procedures Procedures (including critical care time)  Medications Ordered in UC Medications - No data to display  Initial Impression / Assessment and Plan / UC Course  I have reviewed the triage vital signs and the nursing notes.  Pertinent labs & imaging results that were available during my care of the patient were reviewed by me and considered in my medical decision making (see chart for details).    42 year old female presents with a viral respiratory infection.  Treating with prednisone and Atrovent nasal spray.  Debacterol for canker sore.  Final Clinical Impressions(s) / UC Diagnoses   Final diagnoses:  Viral upper respiratory tract infection  Canker sore     Discharge Instructions     Medications as directed.  Take care  Dr. Lacinda Axon    ED Prescriptions    Medication Sig Dispense Auth. Provider   predniSONE (DELTASONE) 50 MG tablet 1 tablet daily x 5 days. 5 tablet [name], DO   Sulfuric Acid-Sulf Phenolics (DEBACTEROL) 14-78 % SOLN Once as needed per package instructions. 12 each Thersa Salt G, DO   ipratropium (ATROVENT) 0.06 % nasal spray Place 2 sprays into both nostrils 4 (four) times daily as needed for rhinitis. 15 mL Coral Spikes, DO     Controlled Substance Prescriptions [place] Controlled Substance Registry consulted? Not Applicable   Coral Spikes, DO 03/25/18 1438

## 2018-04-17 HISTORY — PX: INCONTINENCE SURGERY: SHX676

## 2018-05-25 ENCOUNTER — Other Ambulatory Visit: Payer: Self-pay

## 2018-05-25 ENCOUNTER — Ambulatory Visit
Admission: EM | Admit: 2018-05-25 | Discharge: 2018-05-25 | Disposition: A | Discharge status: Home / Self Care | Payer: Managed Care, Other (non HMO) | Attending: Registered Nurse | Admitting: Registered Nurse

## 2018-05-25 DIAGNOSIS — L509 Urticaria, unspecified: Secondary | ICD-10-CM

## 2018-05-25 DIAGNOSIS — T783XXA Angioneurotic edema, initial encounter: Secondary | ICD-10-CM

## 2018-05-25 MED ORDER — PREDNISONE 10 MG (21) PO TBPK: ORAL_TABLET | ORAL | 0 refills | Status: AC

## 2018-05-25 MED ORDER — RANITIDINE HCL 150 MG PO CAPS: 150.0000 mg | ORAL_CAPSULE | Freq: Every day | ORAL | 0 refills | Status: DC

## 2018-05-25 MED ORDER — METHYLPREDNISOLONE SODIUM SUCC 125 MG IJ SOLR
125.0000 mg | Freq: Once | INTRAMUSCULAR | Status: AC
  Administered 2018-05-25: 125 mg via INTRAMUSCULAR

## 2018-05-25 MED ORDER — EPINEPHRINE 0.3 MG/0.3ML IJ SOAJ: 0.3000 mg | Freq: Once | INTRAMUSCULAR | 0 refills | Status: AC

## 2018-05-25 MED ORDER — DIPHENHYDRAMINE HCL 25 MG PO TABS: 25.0000 mg | ORAL_TABLET | Freq: Three times a day (TID) | ORAL | 0 refills | Status: DC | PRN

## 2018-05-25 NOTE — Discharge Instructions (Signed)
Take 50mg  benadryl when you arrive home Start prednisone taper in 48 hours Take zantac 150mg  by mouth twice a day when you arrive home May continue ice as needed for itching/swelling on arms; avoid scratching

## 2018-05-25 NOTE — ED Provider Notes (Signed)
[facility]    CSN: [id number] Arrival date & time: 05/25/18  1051     History   Chief Complaint Chief Complaint  Patient presents with  . Rash    HPI Amanda Miranda is a 42 y.o. female.   42y/o caucasian female established patient with bilateral arm rash started 72 hours ago (Saturday) not improving worsening and started to feel tingly face/body and felt like lips swelling at work today.  Had similar episode this winter but was on antibiotics with mouth sore at that time.  Did not administer epi pen today "My lips were not as swollen"  Denied swollen tongue, wheezing/dyspnea, chest pain/tightness.  Patient denied any new soaps/detergent/self care product use.  Unsure what triggered this episide just like last time.  Gets migraine medication injection monthly and has been on it for one year.     Past Medical History:  Diagnosis Date  . IBS (irritable bowel syndrome)   . Migraines     There are no active problems to display for this patient.   Past Surgical History:  Procedure Laterality Date  . INCONTINENCE SURGERY  04/17/2018  . neck surgery    . OVARIAN CYST REMOVAL      OB History   None      Home Medications    Prior to Admission medications   Medication Sig Start Date End Date Taking? Authorizing Provider  Erenumab-aooe (AIMOVIG) 140 MG/ML SOAJ Inject 1 Dose into the skin every 30 (thirty) days.   Yes [provider]  levonorgestrel (MIRENA, 52 MG,) 20 MCG/24HR IUD Mirena 20 mcg/24 hours (5 yrs) 52 mg intrauterine device  Take by intrauterine route.   Yes [provider]  diphenhydrAMINE (BENADRYL) 25 MG tablet Take 1-2 tablets (25-50 mg total) by mouth every 8 (eight) hours as needed for up to 7 days. 05/25/18 06/01/18  Betancourt, Aura Fey, NP  EPINEPHrine 0.3 mg/0.3 mL IJ SOAJ injection Inject 0.3 mLs (0.3 mg total) into the muscle once for 1 dose. 05/25/18 05/25/18  Betancourt, Aura Fey, NP  predniSONE (STERAPRED UNI-PAK 21 TAB) 10 MG  (21) TBPK tablet Taper take 6 pills by mouth day 1; 5 pills day 2; 4 pills day 3; 3 pills day 4; 2 pills day 5 and 1 pill day 6 with breakfast daily 05/27/18 06/01/18  Betancourt, Aura Fey, NP  ranitidine (ZANTAC) 150 MG capsule Take 1 capsule (150 mg total) by mouth daily. 05/25/18   Betancourt, Aura Fey, NP    Family History Family History  Problem Relation Age of Onset  . Hypertension Mother   . COPD Father   . Breast cancer Neg Hx     Social History Social History   Tobacco Use  . Smoking status: Never Smoker  . Smokeless tobacco: Never Used  Substance Use Topics  . Alcohol use: Yes    Comment: rarely  . Drug use: No     Allergies   Secnidazole; Sumatriptan succinate; and Zolmitriptan   Review of Systems Review of Systems  Constitutional: Negative for activity change, appetite change, chills, diaphoresis, fatigue and fever.  HENT: Positive for facial swelling and mouth sores. Negative for congestion, dental problem, drooling, ear discharge, ear pain, hearing loss, nosebleeds, postnasal drip, rhinorrhea, sinus pressure, sinus pain, sneezing, sore throat, tinnitus, trouble swallowing and voice change.   Eyes: Negative for photophobia, pain, discharge and visual disturbance.  Respiratory: Negative for cough, choking, chest tightness, shortness of breath, wheezing and stridor.   Cardiovascular: Negative for chest  pain, palpitations and leg swelling.  Gastrointestinal: Negative for blood in stool, constipation, diarrhea, nausea and vomiting.  Endocrine: Negative for cold intolerance and heat intolerance.  Genitourinary: Negative for difficulty urinating, dysuria and hematuria.  Musculoskeletal: Negative for arthralgias, back pain, gait problem, joint swelling, myalgias, neck pain and neck stiffness.  Skin: Positive for color change and rash. Negative for pallor and wound.  Allergic/Immunologic: Negative for environmental allergies and food allergies.  Neurological: Positive for  numbness. Negative for dizziness, tremors, seizures, syncope, facial asymmetry, speech difficulty, weakness, light-headedness and headaches.  Hematological: Negative for adenopathy. Does not bruise/bleed easily.  Psychiatric/Behavioral: Negative for agitation and confusion. The patient is not nervous/anxious.      Physical Exam Triage Vital Signs ED Triage Vitals  Enc Vitals Group     BP 05/25/18 1114 110/72     Pulse Rate 05/25/18 1114 70     Resp 05/25/18 1114 18     Temp 05/25/18 1114 98.6 F (37 C)     Temp Source 05/25/18 1114 Oral     SpO2 05/25/18 1114 100 %     Weight 05/25/18 1112 125 lb (56.7 kg)     Height 05/25/18 1112 5\' 6"  (1.676 m)     Head Circumference --      Peak Flow --      Pain Score 05/25/18 1112 0     Pain Loc --      Pain Edu? --      Excl. in [place]? --    No data found.  Updated Vital Signs BP 124/86 (BP Location: Left Arm)   Pulse 74   Temp 98.6 F (37 C) (Oral)   Resp 16   Ht 5\' 6"  (1.676 m)   Wt 125 lb (56.7 kg)   SpO2 100%   BMI 20.18 kg/m    Physical Exam  Constitutional: She is oriented to person, place, and time. Vital signs are normal. She appears well-developed and well-nourished. She is active and cooperative.  Non-toxic appearance. She does not have a sickly appearance. She does not appear ill. No distress.  HENT:  Head: Normocephalic and atraumatic.  Right Ear: Hearing, external ear and ear canal normal. A middle ear effusion is present.  Left Ear: Hearing, external ear and ear canal normal. A middle ear effusion is present.  Nose: Mucosal edema and rhinorrhea present. No nose lacerations, sinus tenderness, nasal deformity, septal deviation or nasal septal hematoma. No epistaxis.  No foreign bodies. Right sinus exhibits no maxillary sinus tenderness and no frontal sinus tenderness. Left sinus exhibits no maxillary sinus tenderness and no frontal sinus tenderness.  Mouth/Throat: Uvula is midline and mucous membranes are normal. Mucous  membranes are not pale, not dry and not cyanotic. She does not have dentures. No oral lesions. No trismus in the jaw. Normal dentition. No dental abscesses, uvula swelling, lacerations or dental caries. Posterior oropharyngeal edema and posterior oropharyngeal erythema present. No oropharyngeal exudate or tonsillar abscesses. No tonsillar exudate.  Eyes: Pupils are equal, round, and reactive to light. Conjunctivae, EOM and lids are normal. Right eye exhibits no chemosis, no discharge, no exudate and no hordeolum. No foreign body present in the right eye. Left eye exhibits no chemosis, no discharge, no exudate and no hordeolum. No foreign body present in the left eye. Right conjunctiva is not injected. Right conjunctiva has no hemorrhage. Left conjunctiva is not injected. Left conjunctiva has no hemorrhage. No scleral icterus. Right eye exhibits normal extraocular motion and no nystagmus. Left eye exhibits  normal extraocular motion and no nystagmus. Right pupil is round and reactive. Left pupil is round and reactive. Pupils are equal.  Neck: Trachea normal, normal range of motion and phonation normal. Neck supple. No tracheal tenderness, no spinous process tenderness and no muscular tenderness present. No neck rigidity. No tracheal deviation, no edema, no erythema and normal range of motion present. No thyroid mass and no thyromegaly present.  Cardiovascular: Normal rate, regular rhythm, S1 normal, S2 normal, normal heart sounds and intact distal pulses. PMI is not displaced. Exam reveals no gallop and no friction rub.  No murmur heard. Pulses:      Radial pulses are 2+ on the right side, and 2+ on the left side.  Pulmonary/Chest: Effort normal and breath sounds normal. No accessory muscle usage or stridor. No respiratory distress. She has no decreased breath sounds. She has no wheezes. She has no rhonchi. She has no rales. She exhibits no tenderness.  No cough observed in exam room; spoke full sentences  without difficulty  Abdominal: Soft. Normal appearance. She exhibits no distension, no fluid wave and no ascites. There is no rigidity and no guarding.  Musculoskeletal: Normal range of motion. She exhibits no edema or tenderness.       Right shoulder: Normal.       Left shoulder: Normal.       Right elbow: Normal.      Left elbow: Normal.       Right hip: Normal.       Left hip: Normal.       Right knee: Normal.       Left knee: Normal.       Cervical back: Normal.       Thoracic back: Normal.       Lumbar back: Normal.       Right hand: Normal.       Left hand: Normal.  Lymphadenopathy:       Head (right side): No submental, no submandibular, no tonsillar, no preauricular, no posterior auricular and no occipital adenopathy present.       Head (left side): No submental, no submandibular, no tonsillar, no preauricular, no posterior auricular and no occipital adenopathy present.    She has no cervical adenopathy.       Right cervical: No superficial cervical, no deep cervical and no posterior cervical adenopathy present.      Left cervical: No superficial cervical, no deep cervical and no posterior cervical adenopathy present.  Neurological: She is alert and oriented to person, place, and time. She has normal strength. She is not disoriented. She displays no atrophy and no tremor. No cranial nerve deficit or sensory deficit. She exhibits normal muscle tone. She displays no seizure activity. Coordination and gait normal. GCS eye subscore is 4. GCS verbal subscore is 5. GCS motor subscore is 6.  Bilateral hand grasp equal bilaterally 5/5; in/out of chair without difficulty; gait sure and steady in hallway  Skin: Skin is warm, dry and intact. Capillary refill takes less than 2 seconds. Rash noted. No abrasion, no bruising, no burn, no ecchymosis, no laceration, no lesion, no petechiae and no purpura noted. Rash is macular, papular, maculopapular, vesicular and urticarial. Rash is not nodular and  not pustular. She is not diaphoretic. No cyanosis or erythema. No pallor. Nails show no clubbing.     Bilateral anterior fossa macular/papular/vesicular/urticarial grouped lacey macular erythema noted 5x7 and patient itching through clothing long sleeves; applied ice and patient reported some relief with improved after  solumedrol 125mg  IM administered by CMA Gerald Leitz at 1149  Psychiatric: She has a normal mood and affect. Her speech is normal and behavior is normal. Judgment and thought content normal. She is not actively hallucinating. Cognition and memory are normal. She is attentive.  Nursing note and vitals reviewed.   Bilateral arms vesicular papular erythema anterior fossa bilaterally itching in exam room feels like lips swollen  After steroids IM and ice feeling better couldn't administer benadryl as used all doses in stock at site; patient will take zantac 150mg  po BID x 7 days and benadryl 25-50mg  po TID prn x 7 days at home  Refilled both Rx for patient electronic Rx to her pharmacy of choice.  Start prednisone in 48 hours orally since you received IM injection in clinic today for taper 60mg /50/40/30/20/10 with breakfast daily #21 RF0 electronic Rx to her pharmacy of choice.    Work excuse for 48 hours given to patient  UC Treatments / Results  Labs (all labs ordered are listed, but only abnormal results are displayed) Labs Reviewed - No data to display  EKG None  Radiology No results found.  Procedures Procedures (including critical care time)  Medications Ordered in UC Medications  methylPREDNISolone sodium succinate (SOLU-MEDROL) 125 mg/2 mL injection 125 mg (125 mg Intramuscular Given 05/25/18 1149)   Given by [facility] at 1149 solumedrol 125mg  IM Initial Impression / Assessment and Plan / UC Course  I have reviewed the triage vital signs and the nursing notes.  Pertinent labs & imaging results that were available during my care of the patient were  reviewed by me and considered in my medical decision making (see chart for details).    Urticaria/angioedema.  Has 2 epi pens not expired until Dec 2019 gave 1 refill electronically to her pharmacy of choice.  Recommend allergy evaluation as this is second angioedema episode not on antibiotics this time but did not mouth sore again.  Patient presented for evaluation of rash x 72 hours bilateral arms and lip swelling and body tingling  Symptoms are consistent with angioedema and allergic reaction.  Angioedema is likely allergic since patient also has hives.  She is unsure what she is allergic to. t she has not felt any throat tightening or shortness of breath.  She was given IM Solu-Medrol.  She will be given a prescription for prednisone, Benadryl, ranitidine.  She will also be given a prescription for an EpiPen.  Patient is to follow up with dentist, allergy, PCP, ER as directed. Patient is given ED precautions to return to the ED for any worsening or new symptoms.  exitcare handout on angioedema, hives and epi pen use.Benadryl po prn as directed. Symptomatic therapy suggested. Warm to cool water soaks or tepid showers Call or return to clinic as needed if these symptoms worsen or fail to improve as anticipated. Administer epinephrine pen, call [phone] for transport to ER if difficulty breathing, swallowing, dizziness or chest pain for immediate evaluation and treatment follow up with [facility] if ER visit required. Directed to follow up with [facility] for allergist/testing referral. Patient verbalized agreement and understanding of treatment plan and had no further questions at this time.  P2: Avoidance and hand washing.  Vitals:   05/25/18 1112 05/25/18 1114 05/25/18 1200  BP:  110/72 124/86  Pulse:  70 74  Resp:  18 16  Temp:  98.6 F (37 C)   TempSrc:  Oral   SpO2:  100% 100%  Weight: 125 lb (56.7  kg)    Height: 5\' 6"  (1.676 m)     Final Clinical Impressions(s) / UC Diagnoses   Final diagnoses:  Hives      Discharge Instructions     Take 50mg  benadryl when you arrive home Start prednisone taper in 48 hours Take zantac 150mg  by mouth twice a day when you arrive home May continue ice as needed for itching/swelling on arms; avoid scratching   ED Prescriptions    Medication Sig Dispense Auth. Provider   diphenhydrAMINE (BENADRYL) 25 MG tablet Take 1-2 tablets (25-50 mg total) by mouth every 8 (eight) hours as needed for up to 7 days. 20 tablet Betancourt, Tina A, NP   ranitidine (ZANTAC) 150 MG capsule Take 1 capsule (150 mg total) by mouth daily. 30 capsule Betancourt, Tina A, NP   predniSONE (STERAPRED UNI-PAK 21 TAB) 10 MG (21) TBPK tablet Taper take 6 pills by mouth day 1; 5 pills day 2; 4 pills day 3; 3 pills day 4; 2 pills day 5 and 1 pill day 6 with breakfast daily 21 tablet Betancourt, Tina A, NP   EPINEPHrine 0.3 mg/0.3 mL IJ SOAJ injection Inject 0.3 mLs (0.3 mg total) into the muscle once for 1 dose. 1 Device Betancourt, Aura Fey, NP     Controlled Substance Prescriptions [place] Controlled Substance Registry consulted? Not Applicable   Olen Cordial, NP 05/26/18 0004

## 2018-05-25 NOTE — ED Triage Notes (Signed)
Patient complains of rash on both arms that started on Saturday.Patient states that she has felt tingly all over. Patient states that she had anaphylaxis in February from unknown cause. Patient denies any new soaps, detergents or food.

## 2018-05-27 ENCOUNTER — Other Ambulatory Visit: Payer: Self-pay | Admitting: Obstetrics and Gynecology

## 2018-05-27 DIAGNOSIS — Z1231 Encounter for screening mammogram for malignant neoplasm of breast: Secondary | ICD-10-CM

## 2018-05-27 NOTE — Progress Notes (Signed)
I called [facility] and staff member verified they received order and patient had picked up Rx day it was written as ordered.

## 2018-06-09 ENCOUNTER — Inpatient Hospital Stay: Admission: RE | Admit: 2018-06-09 | Payer: Managed Care, Other (non HMO) | Source: Ambulatory Visit

## 2018-06-11 ENCOUNTER — Ambulatory Visit
Admission: RE | Admit: 2018-06-11 | Discharge: 2018-06-11 | Disposition: A | Discharge status: Home / Self Care | Payer: Managed Care, Other (non HMO) | Source: Ambulatory Visit | Attending: Obstetrics and Gynecology | Admitting: Obstetrics and Gynecology

## 2018-06-11 DIAGNOSIS — Z1231 Encounter for screening mammogram for malignant neoplasm of breast: Secondary | ICD-10-CM | POA: Diagnosis present

## 2018-06-19 ENCOUNTER — Encounter: Payer: Self-pay | Admitting: Pulmonary Disease

## 2018-06-19 ENCOUNTER — Ambulatory Visit (INDEPENDENT_AMBULATORY_CARE_PROVIDER_SITE_OTHER): Payer: Managed Care, Other (non HMO) | Admitting: Pulmonary Disease

## 2018-06-19 VITALS — BP 100/60 | HR 79 | Resp 16 | Ht 66.0 in | Wt 126.0 lb

## 2018-06-19 DIAGNOSIS — K219 Gastro-esophageal reflux disease without esophagitis: Secondary | ICD-10-CM

## 2018-06-19 DIAGNOSIS — J3089 Other allergic rhinitis: Secondary | ICD-10-CM | POA: Diagnosis not present

## 2018-06-19 DIAGNOSIS — R059 Cough, unspecified: Secondary | ICD-10-CM

## 2018-06-19 DIAGNOSIS — R05 Cough: Secondary | ICD-10-CM

## 2018-06-19 MED ORDER — AZELASTINE-FLUTICASONE 137-50 MCG/ACT NA SUSP: 1.0000 | Freq: Two times a day (BID) | NASAL | 3 refills | Status: DC

## 2018-06-19 MED ORDER — FLUTICASONE FUROATE 100 MCG/ACT IN AEPB: 1.0000 | INHALATION_SPRAY | Freq: Every day | RESPIRATORY_TRACT | 6 refills | Status: DC

## 2018-06-19 NOTE — Progress Notes (Signed)
Subjective:    Patient ID: Amanda Miranda, female    DOB: 06/08/1976, 42 y.o.   MRN: [id number]  HPI  Amanda Miranda is a 42 year old lifelong never smoker, who is referred for evaluation and management of a cough for three weeks duration. The patient is kindly referred by Brett Fairy, [facility]. The patient has started treatment for potential LPR with omeprazole. She started this two days ago. The patient states that she noted on 18 September that she was ill. She noticed that her heart rate was erratic, she had myalgias, fevers, chills and sweats at that time. She had had a mesh for urinary incontinence placed six weeks prior. Since then she has been battling urinary tract infections. She was evaluated at the emergency room at [facility] on 18 September and a CT scan of the chest was performed as of the associated history of chest discomfort with the above symptoms. She did not have any PE however, she had a 5 mm nodule seen in the right middle lobe no other abnormalities were noted at the time. The report from the radiologist is not note any mention with regards to "chronic bronchitis". The patient states she had an episode of anaphylaxis in February 8889, it is uncertain what triggered this episode. She has no history of asthma previously. She does have history of allergies and is to start "allergy shots". With regards to her cough, nothing seemed to make an impact on her symptoms. As noted she just started omeprazole two days ago. He states that she does have chronic throat clearing and occasional dysphagia. She also feels globus sensation.  Currently she has an event monitor in place. This is to evaluate her palpitations.  The patient does not have any exotic pets. He has a [place] Dane in the home.  Family history, social history and past medical history as well as surgical history has been reviewed.  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   Review of Systems  Constitutional: Negative for activity change, appetite change, chills, diaphoresis, fatigue, fever and unexpected weight change.  HENT: Positive for congestion, ear pain and trouble swallowing.   Eyes: Negative.   Respiratory: Positive for cough, choking and chest tightness. Negative for apnea, shortness of breath and stridor.   Cardiovascular: Positive for chest pain and palpitations.  Gastrointestinal: Negative for abdominal pain, blood in stool, constipation, diarrhea, nausea and vomiting.       Occasional dysphagia  Endocrine: Negative.   Genitourinary: Positive for difficulty urinating, dysuria and frequency.  Musculoskeletal: Positive for joint swelling.  Skin: Positive for rash.  Neurological: Negative.   Hematological: Negative.   Psychiatric/Behavioral:       Anxiety       Objective:   Physical Exam  Constitutional: She is oriented to person, place, and time. She appears well-developed and well-nourished. No distress.  HENT:  Head: Normocephalic and atraumatic.  Right Ear: External ear normal.  Left Ear: External ear normal.  Nose: Nose normal.  Mouth/Throat: Oropharynx is clear and moist. No oropharyngeal exudate.  While service otitis bilaterally. Nasal turbinatet edema.  Eyes: Pupils are equal, round, and reactive to light. Conjunctivae and EOM are normal. No scleral icterus.  Neck: Normal range of motion. Neck supple. No JVD present. No tracheal deviation present. No thyromegaly present.  Cardiovascular: Normal rate, regular rhythm and normal heart sounds. Exam reveals no gallop and no friction rub.  No murmur heard. Pulmonary/Chest:  Effort normal and breath sounds normal. No stridor. She exhibits no tenderness.  Abdominal: Soft. Bowel sounds are normal. She exhibits no distension.  Musculoskeletal: Normal range of motion. She exhibits no edema, tenderness or deformity.  Lymphadenopathy:    She has no cervical adenopathy.   Neurological: She is alert and oriented to person, place, and time. No cranial nerve deficit.  Skin: Skin is warm and dry. She is not diaphoretic.  Psychiatric: Judgment and thought content normal.  Appears somewhat anxious.  Nursing note and vitals reviewed.   I have reviewed the patient's available laboratory reports. I have reviewed her records from [facility] and from her most recent [facility] emergency room visit on 18 September. Radiographic reports were reviewed. Films were not available through our PACS system however the patient was able to pull up on her own computer, this was reviewed with the patient.     Assessment & Plan:    1) Cough: the patient's cough is likely related to gastroesophageal reflux with laryngopharyngeal reflux component. She has been appropriately placed on omeprazole for a trial of acid reduction to see if this ameliorates her symptoms. In addition I believe that there may be an element of eosinophilic bronchitis and/or post infectious cough and will be given her a trial of Arnuity Ellipta one inhalation daily. Patient was shown the proper use of the dry powder inhaler and proper rinsing techniques after use.  2) Laryngo pharyngeal reflux (LPR): anti-reflux measures have been discussed with the patient. She has already started a trial of omeprazole.  3) Perennial allergic rhinitis with seasonal variation: the patient states that she is to start "allergy shots", she is currently not observing nasal hygiene. This was discussed with her. We will give her a trial of Dymista one puff twice a day to each nostril.  4) Lung nodule: she has a 5 mm nodule in the right middle lobe. She is a low risk patient given that she is a lifelong never smoker. At present would recommend following this expectantly.  Patient will be seen in follow-up in 4 to 6 weeks time. She is to contact us prior to that time any new problems arise or her symptoms worsen.  Thank you for  allowing me to participate in this patient's care.

## 2018-06-19 NOTE — Patient Instructions (Signed)
1) Continue Omeprazole 20 mg daily.  2) Arnuity Ellipta 1 puff daily. This is to reduce inflammation in your airway. Rinse your mouth well after use.  3) Dimysta 1 puff twice a day to each nostril.  4) We will see you and follow-up in 4 to 6 weeks time.  5) Your lung test was normal today.

## 2018-07-10 ENCOUNTER — Other Ambulatory Visit: Payer: Self-pay | Admitting: Obstetrics and Gynecology

## 2018-07-10 DIAGNOSIS — Z1231 Encounter for screening mammogram for malignant neoplasm of breast: Secondary | ICD-10-CM

## 2018-07-17 DIAGNOSIS — N393 Stress incontinence (female) (male): Secondary | ICD-10-CM | POA: Insufficient documentation

## 2018-07-23 ENCOUNTER — Ambulatory Visit (INDEPENDENT_AMBULATORY_CARE_PROVIDER_SITE_OTHER): Payer: Managed Care, Other (non HMO) | Admitting: Pulmonary Disease

## 2018-07-23 ENCOUNTER — Encounter: Payer: Self-pay | Admitting: Pulmonary Disease

## 2018-07-23 VITALS — BP 110/70 | HR 73 | Ht 66.0 in | Wt 130.0 lb

## 2018-07-23 DIAGNOSIS — J302 Other seasonal allergic rhinitis: Secondary | ICD-10-CM

## 2018-07-23 DIAGNOSIS — K219 Gastro-esophageal reflux disease without esophagitis: Secondary | ICD-10-CM

## 2018-07-23 DIAGNOSIS — J3089 Other allergic rhinitis: Secondary | ICD-10-CM

## 2018-07-23 DIAGNOSIS — R059 Cough, unspecified: Secondary | ICD-10-CM

## 2018-07-23 DIAGNOSIS — R05 Cough: Secondary | ICD-10-CM | POA: Diagnosis not present

## 2018-07-23 NOTE — Progress Notes (Signed)
Subjective:    Patient ID: Amanda Miranda, female    DOB: Jun 14, 1976, 42 y.o.   MRN: [id number]  HPI 42 year old lifelong never smoker, first evaluated here on 19 June 2018, she was evaluated for cough of three weeks duration at that time. She was seen by [facility] ENT who had started her on omeprazole at that time. She also had evidence of significant rhinitis and postnasal drip. She at the time also noted some occasional dysphagia. She has noticed since she was here that she has improved dramatically she does not have the "cough fits" that she had previously. She continues to have some globus sensation but this is actually improving. She does note that she missed her omeprazole dose two nights in a row and has noted some recurrent symptoms. She has been using her annuity Ellipta for potential eosinophilic bronchitis and feels that the omeprazole and the annuity are both helping her. She has not had any fevers, chills or sweats. She looks well and feels well.   Review of Systems  Constitutional: Negative.   HENT: Positive for postnasal drip (Not as pronounced).   Eyes: Negative.   Respiratory: Negative for cough, chest tightness and shortness of breath.   Cardiovascular: Negative for palpitations (These have subsided from previous.).  Gastrointestinal: Negative.   Skin: Negative.   Psychiatric/Behavioral: The patient is not nervous/anxious.   All other systems reviewed and are negative.      Objective:   Physical Exam  Constitutional: She is oriented to person, place, and time. She appears well-developed and well-nourished.  HENT:  Head: Normocephalic and atraumatic.  Mouth/Throat: Oropharynx is clear and moist.  No significant postnasal drip noted today.  Eyes: Pupils are equal, round, and reactive to light. Conjunctivae are normal. No scleral icterus.  Neck: Neck supple.  Cardiovascular: Normal rate, regular rhythm, normal heart sounds and intact distal pulses.  Pulmonary/Chest:  Effort normal and breath sounds normal.  Abdominal: She exhibits no distension.  Musculoskeletal: Normal range of motion. She exhibits no edema.  Neurological: She is alert and oriented to person, place, and time.  No focal deficit.  Skin: Skin is warm and dry.  Psychiatric: She has a normal mood and affect. Her behavior is normal.          Assessment & Plan:    1) Cough: the patient's cough is likely related to gastroesophageal reflux with laryngopharyngeal reflux component. She actually has shown improvement with omeprazole, she is to continue the same. We will see her in follow-up in 4 to 6 weeks time if she continues to have difficulties she will be referred to G.I. She also has an element of eosinophilic bronchitis and appears to be improved with use of inhaled corticosteroid. Will continue this for another 4 to 6 weeks and consider discontinuing it if her symptoms are resolved.   2) Laryngo pharyngeal reflux (LPR): anti-reflux measures have been discussed with the patient. She missed two doses on omeprazole and noticed recurrence of some of her symptoms. She has been advised to continue anti-reflux measures and also not to miss doses of omeprazole.  3) Perennial allergic rhinitis with seasonal variation: the patient states that she is to start "allergy shots", she is currently observing nasal hygiene. There is significant decrease in the postnasal drip from this issue. Continue Dymista one puff twice a day to each nostril.  4) Lung nodule: she has a 5 mm nodule in the right middle lobe. She is a low risk patient given that she  is a lifelong never smoker. At present would recommend following this expectantly.  Patient will be seen in follow-up in 4 to 6 weeks time. She is to contact us prior to that time any new problems arise or her symptoms worsen.

## 2018-07-23 NOTE — Patient Instructions (Signed)
1) Stay the course with the current regimen.  2) We will see you in follow-up in 4 to 6 weeks time.

## 2018-08-20 ENCOUNTER — Ambulatory Visit: Payer: Managed Care, Other (non HMO) | Admitting: Pulmonary Disease

## 2018-08-21 ENCOUNTER — Encounter: Payer: Self-pay | Admitting: Pulmonary Disease

## 2018-08-21 ENCOUNTER — Ambulatory Visit (INDEPENDENT_AMBULATORY_CARE_PROVIDER_SITE_OTHER): Payer: Managed Care, Other (non HMO) | Admitting: Pulmonary Disease

## 2018-08-21 VITALS — BP 100/80 | HR 87 | Ht 66.0 in | Wt 130.6 lb

## 2018-08-21 DIAGNOSIS — K219 Gastro-esophageal reflux disease without esophagitis: Secondary | ICD-10-CM | POA: Diagnosis not present

## 2018-08-21 DIAGNOSIS — R05 Cough: Secondary | ICD-10-CM

## 2018-08-21 DIAGNOSIS — R059 Cough, unspecified: Secondary | ICD-10-CM

## 2018-08-21 MED ORDER — OMEPRAZOLE 20 MG PO CPDR: 20.0000 mg | DELAYED_RELEASE_CAPSULE | Freq: Two times a day (BID) | ORAL | 2 refills | Status: DC

## 2018-08-21 NOTE — Progress Notes (Signed)
Subjective:    Patient ID: Amanda Miranda, female    DOB: 1976-03-29, 42 y.o.   MRN: [id number]  HPI 42 year old lifelong never smoker, first evaluated here on 19 June 2018, she was evaluated for cough of three weeks duration at that time. She was seen by [facility] ENT who had started her on omeprazole at that time. She also had evidence of significant rhinitis and postnasal drip. Shealso noted some occasional dysphagia. She has noticed since she was here that she has improved dramatically she does not have the "cough fits" that she had previously. She continues to have some globus sensation but this is actually improving. She does note that she missed her omeprazole dose two nights in a row and has noted some recurrent symptoms.  She has discontinued Arnuity Ellipta and feels that it did help her however, she feels that omeprazole helps her the most.  Her cough is worse when she misses omeprazole doses.  She has not had any fevers, chills or sweats. She looks well and feels well.  She has had issues with recurrent UTIs and has been placed on Macrodantin and apparently the intent is to get her on maintenance therapy.  I have advised against this as Macrodantin has severe pulmonary complications when used long-term.   Review of Systems  Constitutional: Negative.   HENT: Positive for trouble swallowing.   Respiratory: Positive for cough and choking.   Gastrointestinal:       Heartburn issues when she misses omeprazole.  Genitourinary: Positive for difficulty urinating, dysuria, frequency and urgency.       Chronic issues with recurrent UTIs  Allergic/Immunologic: Positive for environmental allergies.  Neurological: Negative.   Hematological: Negative.   Psychiatric/Behavioral: Negative.   All other systems reviewed and are negative.      Objective:   Physical Exam Constitutional: She is oriented to person, place, and time. She appears well-developed and well-nourished.  HENT:  Head:  Normocephalic and atraumatic.  Mouth/Throat: Oropharynx is clear and moist.  Eyes: Pupils are equal, round, and reactive to light. Conjunctivae are normal. No scleral icterus.  Neck: Neck supple.  Cardiovascular: Normal rate, regular rhythm, normal heart sounds and intact distal pulses.  Pulmonary/Chest: Effort normal and breath sounds normal.  Abdominal: She exhibits no distension.  Musculoskeletal: Normal range of motion. She exhibits no edema.  Neurological: She is alert and oriented to person, place, and time.  No focal deficit.  Skin: Skin is warm and dry.  Psychiatric: She has a normal mood and affect. Her behavior is normal.         Assessment & Plan:   1)Cough:the patient's cough is likely related to gastroesophageal reflux with laryngopharyngeal reflux component. She actually has shown improvement with omeprazole, has noticed some breakthrough symptoms even despite omeprazole and particularly if she misses a dose.  Will increase omeprazole to twice a day frequency, we will see her in follow-up in 4 to 6 weeks time he may benefit from evaluation by GI.   2) Laryngopharyngeal reflux (LPR):anti-reflux measures have been discussed with the patient.  She misses omeprazole she notices recurrence of her cough. She has been advised to continue anti-reflux measures and also not to miss doses of omeprazole.  3)Perennial allergic rhinitis with seasonal variation:the patient states that she is to start "allergy shots", she is currently observing nasal hygiene. There is significant decrease in the postnasal drip from this issue. Continue Dymistaone puff twice a day to each nostril.  4)Lung nodule:she has a 5  mm nodule in the right middle lobe. She is a low risk patient given that she is a lifelong never smoker. At present would recommend following this expectantly.

## 2018-08-21 NOTE — Patient Instructions (Signed)
1.  We will increase your omeprazole to 1 capsule twice a day.  2.  Be aware that Macrodantin can have pulmonary side effects.  3.  Follow-up in 4 to 6 weeks time.

## 2018-09-01 ENCOUNTER — Other Ambulatory Visit: Payer: Self-pay

## 2018-09-02 ENCOUNTER — Ambulatory Visit (INDEPENDENT_AMBULATORY_CARE_PROVIDER_SITE_OTHER): Payer: Managed Care, Other (non HMO) | Admitting: Gastroenterology

## 2018-09-02 ENCOUNTER — Encounter

## 2018-09-02 ENCOUNTER — Encounter: Payer: Self-pay | Admitting: Gastroenterology

## 2018-09-02 ENCOUNTER — Other Ambulatory Visit: Payer: Self-pay

## 2018-09-02 VITALS — BP 121/79 | HR 70 | Ht 66.0 in | Wt 131.4 lb

## 2018-09-02 DIAGNOSIS — R0989 Other specified symptoms and signs involving the circulatory and respiratory systems: Secondary | ICD-10-CM

## 2018-09-02 DIAGNOSIS — K219 Gastro-esophageal reflux disease without esophagitis: Secondary | ICD-10-CM | POA: Diagnosis not present

## 2018-09-02 DIAGNOSIS — K580 Irritable bowel syndrome with diarrhea: Secondary | ICD-10-CM

## 2018-09-02 MED ORDER — OMEPRAZOLE 40 MG PO CPDR: 40.0000 mg | DELAYED_RELEASE_CAPSULE | Freq: Two times a day (BID) | ORAL | 3 refills | Status: DC

## 2018-09-02 NOTE — Progress Notes (Signed)
Cephas Darby, MD [address]  [place], [place] [address]  Main: [phone]  Fax: [phone]    Gastroenterology Consultation  Referring Provider:     Bethena Roys* Primary Care Physician:  Idelle Crouch, MD Primary Gastroenterologist:  Dr. Cephas Darby Reason for Consultation:     Evaluate for GERD        HPI:   Amanda Miranda is a 42 y.o. female referred by Dr. Doy Hutching, Leonie Douglas, MD  for consultation & management of 1 year history of constant clearing of throat, associated with " globus sensation", and 4 months history of coughing spells that wake her up from sleep.  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  No particular trigger has been identified.  She was evaluated by an allergist and was found to have low level allergies to environment. She also has history of allergic rhinitis and postnasal drip.  She was initially evaluated by [facility] ENT who started her on omeprazole at that time.  She also noticed globus sensation and sensation of "esophagus not moving".  She denies trouble swallowing with solids or liquids.  Since she started omeprazole, she reports dramatic improvement in coughing spells but clearing of throat and globus sensation is persistent.  She noticed recurrence of symptoms if she misses omeprazole. She was also started on antiallergy medication which provided modest relief only.  She is currently on omeprazole 20 mg twice daily increased by pulmonologist Dr. Patsey Berthold.  Patient had a CT abdomen and pelvis in 05/2018 at [facility] which revealed nonspecific abdominal and pelvic lymphadenopathy which was thought to be reactive  She also reports chronic history of intermittent episodes of abdominal cramps associated with bloating and nonbloody diarrhea which are manageable.  She tried dicyclomine which resulted in dryness of mouth.  She reports she was  never tested for celiac disease.  She denies weight loss, iron deficiency anemia, rectal bleeding  NSAIDs: None  Antiplts/Anticoagulants/Anti thrombotics: None  GI Procedures: None She denies family history of GI malignancy, celiac disease or inflammatory bowel disease She denies having any abdominal surgeries  Past Medical History:  Diagnosis Date  . IBS (irritable bowel syndrome)   . Migraines     Past Surgical History:  Procedure Laterality Date  . INCONTINENCE SURGERY  04/17/2018  . neck surgery    . OVARIAN CYST REMOVAL      Current Outpatient Medications:  .  aluminum chloride (DRYSOL) 20 % external solution, Drysol Dab-O-Matic 20 % topical solution, Disp: , Rfl:  .  EPINEPHrine 0.3 mg/0.3 mL IJ SOAJ injection, Inject into the skin., Disp: , Rfl:  .  Erenumab-aooe (AIMOVIG) 140 MG/ML SOAJ, Inject 1 Dose into the skin every 30 (thirty) days., Disp: , Rfl:  .  fluocinonide gel (LIDEX) 0.05 %, fluocinonide 0.05 % topical gel, Disp: , Rfl:  .  Ivermectin 1 % CREA, Soolantra 1 % topical cream, Disp: , Rfl:  .  levonorgestrel (MIRENA, 52 MG,) 20 MCG/24HR IUD, Mirena 20 mcg/24 hours (5 yrs) 52 mg intrauterine device  Take by intrauterine route., Disp: , Rfl:  .  rizatriptan (MAXALT) 5 MG tablet, rizatriptan 5 mg tablet, Disp: , Rfl:  .  Sod Fluoride-Potassium Nitrate (PREVIDENT 5000 SENSITIVE) 1.1-5 % PSTE, Prevident 5000 Enamel Protect 1.1 %-5 % dental paste, Disp: , Rfl:  .  triamcinolone cream (KENALOG) 0.1 %, triamcinolone acetonide  0.1 % topical cream  APPLY TO AFFECTED NON-FACIAL RASH TWICE DAILY UNTIL CLEAR, Disp: , Rfl:  .  valACYclovir (VALTREX) 500 MG tablet, valacyclovir 500 mg tablet  TAKE 1 TABLET BY MOUTH ONCE DAILY, Disp: , Rfl:  .  omeprazole (PRILOSEC) 40 MG capsule, Take 1 capsule (40 mg total) by mouth 2 (two) times daily., Disp: 180 capsule, Rfl: 3   Family History  Problem Relation Age of Onset  . Hypertension Mother   . COPD Father   . Breast cancer Neg  Hx      Social History   Tobacco Use  . Smoking status: Never Smoker  . Smokeless tobacco: Never Used  Substance Use Topics  . Alcohol use: Yes    Comment: rarely  . Drug use: No    Allergies as of 09/02/2018 - Review Complete 09/02/2018  Allergen Reaction Noted  . Secnidazole Anaphylaxis 02/05/2018  . Sumatriptan succinate Other (See Comments) 03/07/2017  . Zolmitriptan Other (See Comments) 03/07/2017    Review of Systems:    All systems reviewed and negative except where noted in HPI.   Physical Exam:  BP 121/79   Pulse 70   Ht 5\' 6"  (1.676 m)   Wt 131 lb 6.4 oz (59.6 kg)   BMI 21.21 kg/m  No LMP recorded. (Menstrual status: IUD).  General:   Alert,  Well-developed, well-nourished, pleasant and cooperative in NAD Head:  Normocephalic and atraumatic. Eyes:  Sclera clear, no icterus.   Conjunctiva pink. Ears:  Normal auditory acuity. Nose:  No deformity, discharge, or lesions. Mouth:  No deformity or lesions,oropharynx pink & moist. Neck:  Supple; no masses or thyromegaly. Lungs:  Respirations even and unlabored.  Clear throughout to auscultation.   No wheezes, crackles, or rhonchi. No acute distress. Heart:  Regular rate and rhythm; no murmurs, clicks, rubs, or gallops. Abdomen:  Normal bowel sounds. Soft, non-tender and non-distended without masses, hepatosplenomegaly or hernias noted.  No guarding or rebound tenderness.   Rectal: Not performed Msk:  Symmetrical without gross deformities. Good, equal movement & strength bilaterally. Pulses:  Normal pulses noted. Extremities:  No clubbing or edema.  No cyanosis. Neurologic:  Alert and oriented x3;  grossly normal neurologically. Skin:  Intact without significant lesions or rashes. No jaundice. Lymph Nodes:  No significant cervical adenopathy. Psych:  Alert and cooperative. Normal mood and affect.  Imaging Studies: Reviewed  Assessment and Plan:   Amanda Miranda is a 42 y.o. Caucasian female with 1 year history  of clearing of throat associated with globus sensation and nocturnal coughing spells which are currently resolved on omeprazole 20 mg twice daily.  She does have history of anaphylactic reaction with multiple environmental allergies.  She does have intermittent self-limited episodes of abdominal cramps, associated with bloating and nonbloody diarrhea  Recommend upper endoscopy with esophageal, gastric and duodenal biopsies Increase omeprazole to 40 mg twice daily   Follow up in 3 to 4 weeks   Cephas Darby, MD

## 2018-09-03 ENCOUNTER — Ambulatory Visit
Admission: RE | Admit: 2018-09-03 | Discharge: 2018-09-03 | Disposition: A | Discharge status: Home / Self Care | Payer: Managed Care, Other (non HMO) | Attending: Gastroenterology | Admitting: Gastroenterology

## 2018-09-03 ENCOUNTER — Encounter
Admission: RE | Disposition: A | Discharge status: Home / Self Care | Payer: Self-pay | Source: Home / Self Care | Attending: Gastroenterology

## 2018-09-03 ENCOUNTER — Ambulatory Visit: Payer: Managed Care, Other (non HMO) | Admitting: Anesthesiology

## 2018-09-03 ENCOUNTER — Encounter: Payer: Self-pay | Admitting: *Deleted

## 2018-09-03 DIAGNOSIS — Z87892 Personal history of anaphylaxis: Secondary | ICD-10-CM | POA: Diagnosis not present

## 2018-09-03 DIAGNOSIS — Z888 Allergy status to other drugs, medicaments and biological substances status: Secondary | ICD-10-CM | POA: Insufficient documentation

## 2018-09-03 DIAGNOSIS — Z975 Presence of (intrauterine) contraceptive device: Secondary | ICD-10-CM | POA: Diagnosis not present

## 2018-09-03 DIAGNOSIS — G43909 Migraine, unspecified, not intractable, without status migrainosus: Secondary | ICD-10-CM | POA: Insufficient documentation

## 2018-09-03 DIAGNOSIS — K219 Gastro-esophageal reflux disease without esophagitis: Secondary | ICD-10-CM

## 2018-09-03 DIAGNOSIS — Z79899 Other long term (current) drug therapy: Secondary | ICD-10-CM | POA: Insufficient documentation

## 2018-09-03 DIAGNOSIS — K580 Irritable bowel syndrome with diarrhea: Secondary | ICD-10-CM | POA: Diagnosis not present

## 2018-09-03 DIAGNOSIS — R1310 Dysphagia, unspecified: Secondary | ICD-10-CM | POA: Insufficient documentation

## 2018-09-03 DIAGNOSIS — K449 Diaphragmatic hernia without obstruction or gangrene: Secondary | ICD-10-CM | POA: Diagnosis not present

## 2018-09-03 HISTORY — PX: ESOPHAGOGASTRODUODENOSCOPY (EGD) WITH PROPOFOL: SHX5813

## 2018-09-03 LAB — POCT PREGNANCY, URINE: Preg Test, Ur: NEGATIVE

## 2018-09-03 SURGERY — ESOPHAGOGASTRODUODENOSCOPY (EGD) WITH PROPOFOL
Anesthesia: General

## 2018-09-03 MED ORDER — MIDAZOLAM HCL 2 MG/2ML IJ SOLN
INTRAMUSCULAR | Status: DC | PRN
  Administered 2018-09-03: 2 mg via INTRAVENOUS

## 2018-09-03 MED ORDER — PROPOFOL 10 MG/ML IV BOLUS
INTRAVENOUS | Status: DC | PRN
  Administered 2018-09-03: 10 mg via INTRAVENOUS
  Administered 2018-09-03: 70 mg via INTRAVENOUS

## 2018-09-03 MED ORDER — SODIUM CHLORIDE 0.9 % IV SOLN
INTRAVENOUS | Status: DC
  Administered 2018-09-03: 10:00:00 via INTRAVENOUS

## 2018-09-03 MED ORDER — PROPOFOL 500 MG/50ML IV EMUL
INTRAVENOUS | Status: AC
  Filled 2018-09-03: qty 50

## 2018-09-03 MED ORDER — MIDAZOLAM HCL 2 MG/2ML IJ SOLN
INTRAMUSCULAR | Status: AC
  Filled 2018-09-03: qty 2

## 2018-09-03 MED ORDER — GLYCOPYRROLATE 0.2 MG/ML IJ SOLN
INTRAMUSCULAR | Status: DC | PRN
  Administered 2018-09-03: 0.2 mg via INTRAVENOUS

## 2018-09-03 MED ORDER — LIDOCAINE HCL (PF) 2 % IJ SOLN
INTRAMUSCULAR | Status: AC
  Filled 2018-09-03: qty 10

## 2018-09-03 MED ORDER — LIDOCAINE HCL (CARDIAC) PF 100 MG/5ML IV SOSY
PREFILLED_SYRINGE | INTRAVENOUS | Status: DC | PRN
  Administered 2018-09-03: 40 mg via INTRAVENOUS

## 2018-09-03 MED ORDER — GLYCOPYRROLATE 0.2 MG/ML IJ SOLN
INTRAMUSCULAR | Status: AC
  Filled 2018-09-03: qty 1

## 2018-09-03 MED ORDER — PROPOFOL 500 MG/50ML IV EMUL
INTRAVENOUS | Status: DC | PRN
  Administered 2018-09-03: 150 ug/kg/min via INTRAVENOUS

## 2018-09-03 NOTE — Anesthesia Procedure Notes (Signed)
Date/Time: 09/03/2018 9:45 AM Performed by: Doreen Salvage, CRNA Pre-anesthesia Checklist: Patient identified, Emergency Drugs available, Suction available and Patient being monitored Patient Re-evaluated:Patient Re-evaluated prior to induction Oxygen Delivery Method: Nasal cannula Induction Type: IV induction Dental Injury: Teeth and Oropharynx as per pre-operative assessment  Comments: Nasal cannula with etCO2 monitoring

## 2018-09-03 NOTE — Transfer of Care (Signed)
Immediate Anesthesia Transfer of Care Note  Patient: KELSY POLACK  Procedure(s) Performed: Procedure(s): ESOPHAGOGASTRODUODENOSCOPY (EGD) WITH PROPOFOL (N/A)  Patient Location: PACU and Endoscopy Unit  Anesthesia Type:General  Level of Consciousness: sedated  Airway & Oxygen Therapy: Patient Spontanous Breathing and Patient connected to nasal cannula oxygen  Post-op Assessment: Report given to RN and Post -op Vital signs reviewed and stable  Post vital signs: Reviewed and stable  Last Vitals:  Vitals:   09/03/18 1000 09/03/18 1006  BP: 100/67 100/67  Pulse: 98 98  Resp: 16 16  Temp: (!) 36.1 C (!) 36.1 C  SpO2: 36% 64%    Complications: No apparent anesthesia complications

## 2018-09-03 NOTE — Anesthesia Post-op Follow-up Note (Signed)
Anesthesia QCDR form completed.        

## 2018-09-03 NOTE — Op Note (Signed)
[facility] Gastroenterology Patient Name: Amanda Miranda Procedure Date: 09/03/2018 9:38 AM MRN: [id number] Account #: [id number] Date of Birth: 10/30/1975 Admit Type: Outpatient Age: 42 Room: [facility] ENDO ROOM 2 Gender: Female Note Status: Finalized Procedure:            Upper GI endoscopy Indications:          Dysphagia, , chronic intermittent diarrhea,bloating Providers:            Lin Landsman MD, MD Referring MD:         Leonie Douglas. Doy Hutching, MD (Referring MD) Medicines:            Monitored Anesthesia Care Complications:        No immediate complications. Estimated blood loss:                        Minimal. Procedure:            Pre-Anesthesia Assessment:                       - Prior to the procedure, a History and Physical was                        performed, and patient medications and allergies were                        reviewed. The patient is competent. The risks and                        benefits of the procedure and the sedation options and                        risks were discussed with the patient. All questions                        were answered and informed consent was obtained.                        Patient identification and proposed procedure were                        verified by the physician, the nurse, the                        anesthesiologist, the anesthetist and the technician in                        the pre-procedure area in the procedure room in the                        endoscopy suite. Mental Status Examination: alert and                        oriented. Airway Examination: normal oropharyngeal                        airway and neck mobility. Respiratory Examination:                        clear to auscultation. CV Examination: normal.  Prophylactic Antibiotics: The patient does not require                        prophylactic antibiotics. Prior Anticoagulants: The                        patient has  taken no previous anticoagulant or                        antiplatelet agents. ASA Grade Assessment: I - A                        normal, healthy patient. After reviewing the risks and                        benefits, the patient was deemed in satisfactory                        condition to undergo the procedure. The anesthesia plan                        was to use monitored anesthesia care (MAC). Immediately                        prior to administration of medications, the patient was                        re-assessed for adequacy to receive sedatives. The                        heart rate, respiratory rate, oxygen saturations, blood                        pressure, adequacy of pulmonary ventilation, and                        response to care were monitored throughout the                        procedure. The physical status of the patient was                        re-assessed after the procedure.                       After obtaining informed consent, the endoscope was                        passed under direct vision. Throughout the procedure,                        the patient's blood pressure, pulse, and oxygen                        saturations were monitored continuously. The Endoscope                        was introduced through the mouth, and advanced to the  second part of duodenum. The upper GI endoscopy was                        accomplished without difficulty. The patient tolerated                        the procedure well. Findings:      The duodenal bulb and second portion of the duodenum were normal.       Biopsies for histology were taken with a cold forceps for evaluation of       celiac disease.      The entire examined stomach was normal. Biopsies were taken with a cold       forceps for Helicobacter pylori testing.      The cardia and gastric fundus were normal on retroflexion.      Esophagogastric landmarks were identified: the  gastroesophageal junction       was found at 40 cm from the incisors.      The gastroesophageal junction and examined esophagus were normal.       Biopsies were obtained from the proximal and distal esophagus with cold       forceps for histology of suspected eosinophilic esophagitis.      A small hiatal hernia was found. Impression:           - Normal duodenal bulb and second portion of the                        duodenum. Biopsied.                       - Normal stomach. Biopsied.                       - Esophagogastric landmarks identified.                       - Normal gastroesophageal junction and esophagus.                        Biopsied. Recommendation:       - Discharge patient to home (with escort).                       - Resume previous diet today.                       - Continue present medications.                       - Await pathology results.                       - Return to my office as previously scheduled. Procedure Code(s):    --- Professional ---                       [phone], Esophagogastroduodenoscopy, flexible, transoral;                        with biopsy, single or multiple Diagnosis Code(s):    --- Professional ---                       R13.10, Dysphagia, unspecified CPT copyright 2018  American Medical Association. All rights reserved. The codes documented in this report are preliminary and upon coder review may  be revised to meet current compliance requirements. Dr. Ulyess Mort Lin Landsman MD, MD 09/03/2018 10:05:04 AM This report has been signed electronically. Number of Addenda: 0 Note Initiated On: 09/03/2018 9:38 AM      [facility]

## 2018-09-03 NOTE — H&P (Signed)
Amanda Darby, MD [address]  [place]  [place], [place] [address]  Main: [phone]  Fax: [phone] Pager: [phone]  Primary Care Physician:  Amanda Crouch, MD Primary Gastroenterologist:  Dr. Cephas Miranda  Pre-Procedure History & Physical: HPI:  Amanda Miranda is a 42 y.o. female is here for an endoscopy.   Past Medical History:  Diagnosis Date  . IBS (irritable bowel syndrome)   . Migraines     Past Surgical History:  Procedure Laterality Date  . INCONTINENCE SURGERY  04/17/2018  . neck surgery    . OVARIAN CYST REMOVAL      Prior to Admission medications   Medication Sig Start Date End Date Taking? Authorizing Provider  Erenumab-aooe (AIMOVIG) 140 MG/ML SOAJ Inject 1 Dose into the skin every 30 (thirty) days.   Yes [provider]  fluocinonide gel (LIDEX) 0.05 % fluocinonide 0.05 % topical gel   Yes [provider]  Ivermectin 1 % CREA Soolantra 1 % topical cream   Yes [provider]  omeprazole (PRILOSEC) 40 MG capsule Take 1 capsule (40 mg total) by mouth 2 (two) times daily. 09/02/18  Yes Amanda Miranda, Amanda Due, MD  rizatriptan (MAXALT) 5 MG tablet rizatriptan 5 mg tablet 06/02/17  Yes [provider]  Sod Fluoride-Potassium Nitrate (PREVIDENT 5000 SENSITIVE) 1.1-5 % PSTE Prevident 5000 Enamel Protect 1.1 %-5 % dental paste   Yes [provider]  triamcinolone cream (KENALOG) 0.1 % triamcinolone acetonide 0.1 % topical cream  APPLY TO AFFECTED NON-FACIAL RASH TWICE DAILY UNTIL CLEAR   Yes [provider]  aluminum chloride (DRYSOL) 20 % external solution Drysol Dab-O-Matic 20 % topical solution    [provider]  EPINEPHrine 0.3 mg/0.3 mL IJ SOAJ injection Inject into the skin. 05/25/18   [provider]  levonorgestrel (MIRENA, 52 MG,) 20 MCG/24HR IUD Mirena 20 mcg/24 hours (5 yrs) 52 mg intrauterine device  Take by intrauterine route.    [provider]  valACYclovir  (VALTREX) 500 MG tablet valacyclovir 500 mg tablet  TAKE 1 TABLET BY MOUTH ONCE DAILY    [provider]    Allergies as of 09/02/2018 - Review Complete 09/02/2018  Allergen Reaction Noted  . Secnidazole Anaphylaxis 02/05/2018  . Sumatriptan succinate Other (See Comments) 03/07/2017  . Zolmitriptan Other (See Comments) 03/07/2017    Family History  Problem Relation Age of Onset  . Hypertension Mother   . COPD Father   . Breast cancer Neg Hx     Social History   Socioeconomic History  . Marital status: Single    Spouse name: Not on file  . Number of children: Not on file  . Years of education: Not on file  . Highest education level: Not on file  Occupational History  . Not on file  Social Needs  . Financial resource strain: Not on file  . Food insecurity:    Worry: Not on file    Inability: Not on file  . Transportation needs:    Medical: Not on file    Non-medical: Not on file  Tobacco Use  . Smoking status: Never Smoker  . Smokeless tobacco: Never Used  Substance and Sexual Activity  . Alcohol use: Yes    Comment: rarely  . Drug use: No  . Sexual activity: Not on file  Lifestyle  . Physical activity:    Days per week: Not on file    Minutes per session: Not on file  . Stress: Not  on file  Relationships  . Social connections:    Talks on phone: Not on file    Gets together: Not on file    Attends religious service: Not on file    Active member of club or organization: Not on file    Attends meetings of clubs or organizations: Not on file    Relationship status: Not on file  . Intimate partner violence:    Fear of current or ex partner: Not on file    Emotionally abused: Not on file    Physically abused: Not on file    Forced sexual activity: Not on file  Other Topics Concern  . Not on file  Social History Narrative  . Not on file    Review of Systems: See HPI, otherwise negative ROS  Physical Exam: BP 104/68   Pulse 80   Temp (!) 97.5  F (36.4 C) (Tympanic)   Resp 16   Ht 5\' 6"  (1.676 m)   Wt 56.7 kg   SpO2 100%   BMI 20.18 kg/m  General:   Alert,  pleasant and cooperative in NAD Head:  Normocephalic and atraumatic. Neck:  Supple; no masses or thyromegaly. Lungs:  Clear throughout to auscultation.    Heart:  Regular rate and rhythm. Abdomen:  Soft, nontender and nondistended. Normal bowel sounds, without guarding, and without rebound.   Neurologic:  Alert and  oriented x4;  grossly normal neurologically.  Impression/Plan: Amanda Miranda is here for an endoscopy to be performed for dysphagia  Risks, benefits, limitations, and alternatives regarding  endoscopy have been reviewed with the patient.  Questions have been answered.  All parties agreeable.   Sherri Sear, MD  09/03/2018, 9:19 AM

## 2018-09-03 NOTE — Anesthesia Preprocedure Evaluation (Addendum)
Anesthesia Evaluation  Patient identified by MRN, date of birth, ID band Patient awake    Reviewed: Allergy & Precautions, H&P , NPO status , Patient's Chart, lab work & pertinent test results  Airway Mallampati: II  TM Distance: >3 FB     Dental  (+) Teeth Intact   Pulmonary neg pulmonary ROS, neg COPD,           Cardiovascular (-) angina+ dysrhythmias (palpitations, wore Holter monitor x 1 month, no meds)      Neuro/Psych  Headaches, negative psych ROS   GI/Hepatic negative GI ROS, Neg liver ROS,   Endo/Other  negative endocrine ROS  Renal/GU negative Renal ROS  negative genitourinary   Musculoskeletal   Abdominal   Peds  Hematology negative hematology ROS (+)   Anesthesia Other Findings Past Medical History: No date: IBS (irritable bowel syndrome) No date: Migraines  Past Surgical History: 04/17/2018: INCONTINENCE SURGERY No date: neck surgery No date: OVARIAN CYST REMOVAL  BMI    Body Mass Index:  20.18 kg/m      Reproductive/Obstetrics negative OB ROS                            Anesthesia Physical Anesthesia Plan  ASA: I  Anesthesia Plan: General   Post-op Pain Management:    Induction:   PONV Risk Score and Plan: Propofol infusion and TIVA  Airway Management Planned:   Additional Equipment:   Intra-op Plan:   Post-operative Plan:   Informed Consent: I have reviewed the patients History and Physical, chart, labs and discussed the procedure including the risks, benefits and alternatives for the proposed anesthesia with the patient or authorized representative who has indicated his/her understanding and acceptance.   Dental Advisory Given  Plan Discussed with: Anesthesiologist, CRNA and Surgeon  Anesthesia Plan Comments:         Anesthesia Quick Evaluation

## 2018-09-04 NOTE — Anesthesia Postprocedure Evaluation (Signed)
Anesthesia Post Note  Patient: Amanda Miranda  Procedure(s) Performed: ESOPHAGOGASTRODUODENOSCOPY (EGD) WITH PROPOFOL (N/A )  Patient location during evaluation: PACU Anesthesia Type: General Level of consciousness: awake and alert Pain management: pain level controlled Vital Signs Assessment: post-procedure vital signs reviewed and stable Respiratory status: spontaneous breathing, nonlabored ventilation, respiratory function stable and patient connected to nasal cannula oxygen Cardiovascular status: blood pressure returned to baseline and stable Postop Assessment: no apparent nausea or vomiting Anesthetic complications: no     Last Vitals:  Vitals:   09/03/18 1030 09/03/18 1040  BP: 114/86 112/79  Pulse: 92 80  Resp: 17 16  Temp:    SpO2: 100% 100%    Last Pain:  Vitals:   09/04/18 0718  TempSrc:   PainSc: 0-No pain                 Durenda Hurt

## 2018-09-06 LAB — SURGICAL PATHOLOGY

## 2018-09-24 ENCOUNTER — Ambulatory Visit: Payer: Managed Care, Other (non HMO) | Admitting: Gastroenterology

## 2018-10-13 ENCOUNTER — Ambulatory Visit: Payer: Managed Care, Other (non HMO) | Admitting: Gastroenterology

## 2018-11-02 ENCOUNTER — Encounter: Payer: Self-pay | Admitting: Gastroenterology

## 2018-11-02 ENCOUNTER — Ambulatory Visit: Payer: Managed Care, Other (non HMO) | Admitting: Gastroenterology

## 2018-11-02 VITALS — BP 115/75 | HR 79 | Resp 17 | Ht 66.0 in | Wt 132.6 lb

## 2018-11-02 DIAGNOSIS — K219 Gastro-esophageal reflux disease without esophagitis: Secondary | ICD-10-CM | POA: Diagnosis not present

## 2018-11-02 NOTE — Progress Notes (Signed)
Cephas Darby, MD [address]  [place]  [place], [place] [address]  Main: [phone]  Fax: [phone]    Gastroenterology Consultation  Referring Provider:     Idelle Crouch, MD Primary Care Physician:  Idelle Crouch, MD Primary Gastroenterologist:  Dr. Cephas Darby Reason for Consultation: GERD        HPI:   Amanda GWYNN is a 43 y.o. female referred by Dr. Idelle Crouch, MD  for consultation & management of 1 year history of constant clearing of throat, associated with " globus sensation", and 4 months history of coughing spells that wake her up from sleep.  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  No particular trigger has been identified.  She was evaluated by an allergist and was found to have low level allergies to environment. She also has history of allergic rhinitis and postnasal drip.  She was initially evaluated by [facility] ENT who started her on omeprazole at that time.  She also noticed globus sensation and sensation of "esophagus not moving".  She denies trouble swallowing with solids or liquids.  Since she started omeprazole, she reports dramatic improvement in coughing spells but clearing of throat and globus sensation is persistent.  She noticed recurrence of symptoms if she misses omeprazole. She was also started on antiallergy medication which provided modest relief only.  She is currently on omeprazole 20 mg twice daily increased by pulmonologist Dr. Patsey Berthold.  Patient had a CT abdomen and pelvis in 05/2018 at [facility] which revealed nonspecific abdominal and pelvic lymphadenopathy which was thought to be reactive  She also reports chronic history of intermittent episodes of abdominal cramps associated with bloating and nonbloody diarrhea which are manageable.  She tried dicyclomine which resulted in dryness of mouth.  She reports she was never tested for celiac  disease.  She denies weight loss, iron deficiency anemia, rectal bleeding  Follow-up visit 11/02/2018 She underwent EGD with esophageal, gastric and duodenal biopsies which were unremarkable.  She is taking omeprazole 40 mg once a day which provides significant relief of her symptoms.  NSAIDs: None  Antiplts/Anticoagulants/Anti thrombotics: None  GI Procedures: None She denies family history of GI malignancy, celiac disease or inflammatory bowel disease She denies having any abdominal surgeries  Past Medical History:  Diagnosis Date  . IBS (irritable bowel syndrome)   . Migraines     Past Surgical History:  Procedure Laterality Date  . ESOPHAGOGASTRODUODENOSCOPY (EGD) WITH PROPOFOL N/A 09/03/2018   Procedure: ESOPHAGOGASTRODUODENOSCOPY (EGD) WITH PROPOFOL;  Surgeon: Lin Landsman, MD;  Location: [facility];  Service: Gastroenterology;  Laterality: N/A;  . INCONTINENCE SURGERY  04/17/2018  . neck surgery    . OVARIAN CYST REMOVAL      Current Outpatient Medications:  .  aluminum chloride (DRYSOL) 20 % external solution, Drysol Dab-O-Matic 20 % topical solution, Disp: , Rfl:  .  EPINEPHrine 0.3 mg/0.3 mL IJ SOAJ injection, Inject into the skin., Disp: , Rfl:  .  Erenumab-aooe (AIMOVIG) 140 MG/ML SOAJ, Inject 1 Dose into the skin every 30 (thirty) days., Disp: , Rfl:  .  levonorgestrel (MIRENA, 52 MG,) 20 MCG/24HR IUD, Mirena 20 mcg/24 hours (5 yrs) 52 mg intrauterine device  Take by intrauterine route., Disp: , Rfl:  .  omeprazole (PRILOSEC) 40 MG capsule, Take 1 capsule (40 mg total) by mouth 2 (two) times daily., Disp: 180  capsule, Rfl: 3 .  Sod Fluoride-Potassium Nitrate (PREVIDENT 5000 SENSITIVE) 1.1-5 % PSTE, Prevident 5000 Enamel Protect 1.1 %-5 % dental paste, Disp: , Rfl:  .  valACYclovir (VALTREX) 500 MG tablet, valacyclovir 500 mg tablet  TAKE 1 TABLET BY MOUTH ONCE DAILY, Disp: , Rfl:  .  fluocinonide gel (LIDEX) 0.05 %, fluocinonide 0.05 % topical gel, Disp: ,  Rfl:  .  Ivermectin 1 % CREA, Soolantra 1 % topical cream, Disp: , Rfl:  .  rizatriptan (MAXALT) 5 MG tablet, rizatriptan 5 mg tablet, Disp: , Rfl:  .  triamcinolone cream (KENALOG) 0.1 %, triamcinolone acetonide 0.1 % topical cream  APPLY TO AFFECTED NON-FACIAL RASH TWICE DAILY UNTIL CLEAR, Disp: , Rfl:    Family History  Problem Relation Age of Onset  . Hypertension Mother   . COPD Father   . Breast cancer Neg Hx      Social History   Tobacco Use  . Smoking status: Never Smoker  . Smokeless tobacco: Never Used  Substance Use Topics  . Alcohol use: Yes    Comment: rarely  . Drug use: No    Allergies as of 11/02/2018 - Review Complete 11/02/2018  Allergen Reaction Noted  . Secnidazole Anaphylaxis 02/05/2018  . Sumatriptan succinate Other (See Comments) 03/07/2017  . Zolmitriptan Other (See Comments) 03/07/2017    Review of Systems:    All systems reviewed and negative except where noted in HPI.   Physical Exam:  BP 115/75 (BP Location: Left Arm, Patient Position: Sitting, Cuff Size: Normal)   Pulse 79   Resp 17   Ht 5\' 6"  (1.676 m)   Wt 132 lb 9.6 oz (60.1 kg)   BMI 21.40 kg/m  No LMP recorded. (Menstrual status: IUD).  General:   Alert,  Well-developed, well-nourished, pleasant and cooperative in NAD Head:  Normocephalic and atraumatic. Eyes:  Sclera clear, no icterus.   Conjunctiva pink. Ears:  Normal auditory acuity. Nose:  No deformity, discharge, or lesions. Mouth:  No deformity or lesions,oropharynx pink & moist. Neck:  Supple; no masses or thyromegaly. Lungs:  Respirations even and unlabored.  Clear throughout to auscultation.   No wheezes, crackles, or rhonchi. No acute distress. Heart:  Regular rate and rhythm; no murmurs, clicks, rubs, or gallops. Abdomen:  Normal bowel sounds. Soft, non-tender and non-distended without masses, hepatosplenomegaly or hernias noted.  No guarding or rebound tenderness.   Rectal: Not performed Msk:  Symmetrical without  gross deformities. Good, equal movement & strength bilaterally. Pulses:  Normal pulses noted. Extremities:  No clubbing or edema.  No cyanosis. Neurologic:  Alert and oriented x3;  grossly normal neurologically. Skin:  Intact without significant lesions or rashes. No jaundice. Lymph Nodes:  No significant cervical adenopathy. Psych:  Alert and cooperative. Normal mood and affect.  Imaging Studies: Reviewed  Assessment and Plan:   SHOLONDA JOBST is a 43 y.o. Caucasian female with 1 year history of clearing of throat associated with globus sensation and nocturnal coughing spells which are currently resolved on omeprazole 40 mg once daily.  She does have history of anaphylactic reaction with multiple environmental allergies.  She does have intermittent self-limited episodes of abdominal cramps, associated with bloating and nonbloody diarrhea. 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

## 2018-11-12 ENCOUNTER — Encounter: Payer: Self-pay | Admitting: Gastroenterology

## 2019-02-02 ENCOUNTER — Ambulatory Visit: Payer: Managed Care, Other (non HMO) | Admitting: Gastroenterology

## 2019-03-30 ENCOUNTER — Ambulatory Visit: Payer: Managed Care, Other (non HMO) | Admitting: Gastroenterology

## 2019-05-05 ENCOUNTER — Ambulatory Visit: Payer: Managed Care, Other (non HMO) | Admitting: Gastroenterology

## 2019-06-02 ENCOUNTER — Other Ambulatory Visit: Payer: Self-pay

## 2019-06-02 ENCOUNTER — Ambulatory Visit (INDEPENDENT_AMBULATORY_CARE_PROVIDER_SITE_OTHER): Payer: Managed Care, Other (non HMO) | Admitting: Gastroenterology

## 2019-06-02 ENCOUNTER — Encounter: Payer: Self-pay | Admitting: Gastroenterology

## 2019-06-02 VITALS — BP 111/72 | HR 73 | Temp 98.4°F | Ht 66.0 in | Wt 135.0 lb

## 2019-06-02 DIAGNOSIS — K582 Mixed irritable bowel syndrome: Secondary | ICD-10-CM

## 2019-06-02 DIAGNOSIS — K219 Gastro-esophageal reflux disease without esophagitis: Secondary | ICD-10-CM

## 2019-06-02 MED ORDER — DICYCLOMINE HCL 10 MG PO CAPS: 10.0000 mg | ORAL_CAPSULE | Freq: Three times a day (TID) | ORAL | 0 refills | Status: DC

## 2019-06-02 MED ORDER — OMEPRAZOLE 20 MG PO CPDR: 20.0000 mg | DELAYED_RELEASE_CAPSULE | Freq: Every day | ORAL | 2 refills | Status: DC

## 2019-06-02 NOTE — Progress Notes (Signed)
Amanda Darby, MD [address]  [place]  [place], [place] [address]  Main: [phone]  Fax: [phone]    Gastroenterology Consultation  Referring Provider:     Idelle Crouch, MD Primary Care Physician:  Amanda Crouch, MD Primary Gastroenterologist:  Dr. Cephas Miranda Reason for Consultation: GERD        HPI:   Amanda Miranda is a 43 y.o. female referred by Dr. Idelle Crouch, MD  for consultation & management of 1 year history of constant clearing of throat, associated with " globus sensation", and 4 months history of coughing spells that wake her up from sleep.  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  No particular trigger has been identified.  She was evaluated by an allergist and was found to have low level allergies to environment. She also has history of allergic rhinitis and postnasal drip.  She was initially evaluated by [facility] ENT who started her on omeprazole at that time.  She also noticed globus sensation and sensation of "esophagus not moving".  She denies trouble swallowing with solids or liquids.  Since she started omeprazole, she reports dramatic improvement in coughing spells but clearing of throat and globus sensation is persistent.  She noticed recurrence of symptoms if she misses omeprazole. She was also started on antiallergy medication which provided modest relief only.  She is currently on omeprazole 20 mg twice daily increased by pulmonologist Dr. Patsey Berthold.  Patient had a CT abdomen and pelvis in 05/2018 at [facility] which revealed nonspecific abdominal and pelvic lymphadenopathy which was thought to be reactive  She also reports chronic history of intermittent episodes of abdominal cramps associated with bloating and nonbloody diarrhea which are manageable.  She tried dicyclomine which resulted in dryness of mouth.  She reports she was never tested for celiac  disease.  She denies weight loss, iron deficiency anemia, rectal bleeding  Follow-up visit 11/02/2018 She underwent EGD with esophageal, gastric and duodenal biopsies which were unremarkable.  She is taking omeprazole 40 mg once a day which provides significant relief of her symptoms.  Follow-up visit 06/02/2019 Patient reports doing very well from GERD standpoint on omeprazole 40 mg daily before breakfast.  But for the last few months, she has been experiencing recurrence of her IBS symptoms including alternating episodes of diarrhea and constipation, abdominal cramps.  She has been having the symptoms for last 20 years or so and adjusting her lifestyle around the symptoms.  She is careful when she dines out.  Her main issue is to find a bathroom due to post prandial urgency whenever she eats out.  Otherwise, she denies any weight loss.  She has been trying to be physically active, camping with her children  NSAIDs: None  Antiplts/Anticoagulants/Anti thrombotics: None  GI Procedures: None She denies family history of GI malignancy, celiac disease or inflammatory bowel disease She denies having any abdominal surgeries  Past Medical History:  Diagnosis Date  . IBS (irritable bowel syndrome)   . Migraines     Past Surgical History:  Procedure Laterality Date  . ESOPHAGOGASTRODUODENOSCOPY (EGD) WITH PROPOFOL N/A 09/03/2018   Procedure: ESOPHAGOGASTRODUODENOSCOPY (EGD) WITH PROPOFOL;  Surgeon: Lin Landsman, MD;  Location: [facility];  Service: Gastroenterology;  Laterality: N/A;  . INCONTINENCE SURGERY  04/17/2018  . neck surgery    . OVARIAN CYST REMOVAL  Current Outpatient Medications:  .  EPINEPHrine 0.3 mg/0.3 mL IJ SOAJ injection, Inject into the skin., Disp: , Rfl:  .  Erenumab-aooe (AIMOVIG) 140 MG/ML SOAJ, Inject 1 Dose into the skin every 30 (thirty) days., Disp: , Rfl:  .  levonorgestrel (MIRENA, 52 MG,) 20 MCG/24HR IUD, Mirena 20 mcg/24 hours (5 yrs) 52 mg  intrauterine device  Take by intrauterine route., Disp: , Rfl:  .  rizatriptan (MAXALT) 5 MG tablet, rizatriptan 5 mg tablet, Disp: , Rfl:  .  Sod Fluoride-Potassium Nitrate (PREVIDENT 5000 SENSITIVE) 1.1-5 % PSTE, Prevident 5000 Enamel Protect 1.1 %-5 % dental paste, Disp: , Rfl:  .  valACYclovir (VALTREX) 500 MG tablet, valacyclovir 500 mg tablet  TAKE 1 TABLET BY MOUTH ONCE DAILY, Disp: , Rfl:  .  aluminum chloride (DRYSOL) 20 % external solution, Drysol Dab-O-Matic 20 % topical solution, Disp: , Rfl:  .  dicyclomine (BENTYL) 10 MG capsule, Take 1 capsule (10 mg total) by mouth 3 (three) times daily before meals for 30 doses. As needed, Disp: 30 capsule, Rfl: 0 .  omeprazole (PRILOSEC) 20 MG capsule, Take 1 capsule (20 mg total) by mouth daily before breakfast., Disp: 30 capsule, Rfl: 2 .  triamcinolone cream (KENALOG) 0.1 %, triamcinolone acetonide 0.1 % topical cream  APPLY TO AFFECTED NON-FACIAL RASH TWICE DAILY UNTIL CLEAR, Disp: , Rfl:    Family History  Problem Relation Age of Onset  . Hypertension Mother   . COPD Father   . Breast cancer Neg Hx      Social History   Tobacco Use  . Smoking status: Never Smoker  . Smokeless tobacco: Never Used  Substance Use Topics  . Alcohol use: Yes    Comment: rarely  . Drug use: No    Allergies as of 06/02/2019 - Review Complete 06/02/2019  Allergen Reaction Noted  . Secnidazole Anaphylaxis 02/05/2018  . Sumatriptan succinate Other (See Comments) 03/07/2017  . Zolmitriptan Other (See Comments) 03/07/2017    Review of Systems:    All systems reviewed and negative except where noted in HPI.   Physical Exam:  BP 111/72   Pulse 73   Temp 98.4 F (36.9 C) (Temporal)   Ht 5\' 6"  (1.676 m)   Wt 135 lb (61.2 kg)   BMI 21.79 kg/m  No LMP recorded. (Menstrual status: IUD).  General:   Alert,  Well-developed, well-nourished, pleasant and cooperative in NAD Head:  Normocephalic and atraumatic. Eyes:  Sclera clear, no icterus.    Conjunctiva pink. Ears:  Normal auditory acuity. Nose:  No deformity, discharge, or lesions. Mouth:  No deformity or lesions,oropharynx pink & moist. Neck:  Supple; no masses or thyromegaly. Lungs:  Respirations even and unlabored.  Clear throughout to auscultation.   No wheezes, crackles, or rhonchi. No acute distress. Heart:  Regular rate and rhythm; no murmurs, clicks, rubs, or gallops. Abdomen:  Normal bowel sounds. Soft, non-tender and non-distended without masses, hepatosplenomegaly or hernias noted.  No guarding or rebound tenderness.   Rectal: Not performed Msk:  Symmetrical without gross deformities. Good, equal movement & strength bilaterally. Pulses:  Normal pulses noted. Extremities:  No clubbing or edema.  No cyanosis. Neurologic:  Alert and oriented x3;  grossly normal neurologically. Skin:  Intact without significant lesions or rashes. No jaundice. Lymph Nodes:  No significant cervical adenopathy. Psych:  Alert and cooperative. Normal mood and affect.  Imaging Studies: Reviewed  Assessment and Plan:   Amanda Miranda is a 43 y.o. Caucasian female with 1 year  history of clearing of throat associated with globus sensation and nocturnal coughing spells which are currently resolved on omeprazole 40 mg once daily.  She does have history of anaphylactic reaction with multiple environmental allergies.  She has history of IBS with alternating episodes of diarrhea and constipation.  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  Advised her about rebound reflux symptoms with discontinuation of PPI.  H2 blocker as needed for rebound reflux symptoms.  If her symptoms recur off PPI, next it would be to evaluate for antireflux surgery Reiterated on antireflux lifestyle Avoid food triggers  IBS: Alternating diarrhea and constipation Trial of IBgard  Trial of dicyclomine during episodes of diarrhea and postprandial urgency   Follow up in 3 months   Amanda Darby, MD

## 2019-07-12 ENCOUNTER — Other Ambulatory Visit: Payer: Self-pay | Admitting: Obstetrics and Gynecology

## 2019-07-12 DIAGNOSIS — Z1231 Encounter for screening mammogram for malignant neoplasm of breast: Secondary | ICD-10-CM

## 2019-08-30 ENCOUNTER — Other Ambulatory Visit: Payer: Self-pay | Admitting: Gastroenterology

## 2019-08-30 DIAGNOSIS — K219 Gastro-esophageal reflux disease without esophagitis: Secondary | ICD-10-CM

## 2019-09-22 ENCOUNTER — Other Ambulatory Visit: Payer: Self-pay

## 2019-09-22 ENCOUNTER — Ambulatory Visit
Admission: RE | Admit: 2019-09-22 | Discharge: 2019-09-22 | Disposition: A | Discharge status: Home / Self Care | Payer: Managed Care, Other (non HMO) | Source: Ambulatory Visit | Attending: Obstetrics and Gynecology | Admitting: Obstetrics and Gynecology

## 2019-09-22 DIAGNOSIS — Z1231 Encounter for screening mammogram for malignant neoplasm of breast: Secondary | ICD-10-CM | POA: Insufficient documentation

## 2020-02-03 ENCOUNTER — Ambulatory Visit: Payer: Self-pay | Attending: Internal Medicine

## 2020-02-03 DIAGNOSIS — Z23 Encounter for immunization: Secondary | ICD-10-CM

## 2020-02-03 NOTE — Progress Notes (Signed)
   Covid-19 Vaccination Clinic  Name:  Amanda Miranda    MRN: [id number] DOB: 10/23/1975  02/03/2020  [name] was observed post Covid-19 immunization for 15 minutes without incident. She was provided with Vaccine Information Sheet and instruction to access the V-Safe system.   [name] was instructed to call 911 with any severe reactions post vaccine: Marland Kitchen Difficulty breathing  . Swelling of face and throat  . A fast heartbeat  . A bad rash all over body  . Dizziness and weakness   Immunizations Administered    Name Date Dose VIS Date Route   Pfizer COVID-19 Vaccine 02/03/2020  5:40 PM 0.3 mL 11/10/2018 Intramuscular   Manufacturer: [place]   Lot: [id number]   [place]: [id number]

## 2020-02-24 ENCOUNTER — Ambulatory Visit: Payer: Managed Care, Other (non HMO) | Attending: Internal Medicine

## 2020-02-24 DIAGNOSIS — Z23 Encounter for immunization: Secondary | ICD-10-CM

## 2020-02-24 NOTE — Progress Notes (Signed)
° °  Covid-19 Vaccination Clinic  Name:  Amanda Miranda    MRN: [id number] DOB: [birth year]  02/24/2020  Amanda Miranda was observed post Covid-19 immunization for 15 minutes without incident. She was provided with Vaccine Information Sheet and instruction to access the V-Safe system.   Amanda Miranda was instructed to call 911 with any severe reactions post vaccine:  Difficulty breathing   Swelling of face and throat   A fast heartbeat   A bad rash all over body   Dizziness and weakness   Immunizations Administered    Name Date Dose VIS Date Route   Pfizer COVID-19 Vaccine 02/24/2020  5:30 PM 0.3 mL 11/10/2018 Intramuscular   Manufacturer: [place]   Lot: [id number]   [place]: [id number]

## 2020-06-19 ENCOUNTER — Other Ambulatory Visit: Payer: Self-pay

## 2020-06-21 ENCOUNTER — Other Ambulatory Visit: Payer: Self-pay

## 2020-06-21 ENCOUNTER — Ambulatory Visit: Payer: Managed Care, Other (non HMO) | Admitting: Gastroenterology

## 2020-06-21 ENCOUNTER — Encounter: Payer: Self-pay | Admitting: Gastroenterology

## 2020-06-21 VITALS — BP 106/75 | HR 76 | Temp 98.4°F | Ht 66.0 in | Wt 138.2 lb

## 2020-06-21 DIAGNOSIS — Z1211 Encounter for screening for malignant neoplasm of colon: Secondary | ICD-10-CM | POA: Diagnosis not present

## 2020-06-21 DIAGNOSIS — K580 Irritable bowel syndrome with diarrhea: Secondary | ICD-10-CM

## 2020-06-21 DIAGNOSIS — K219 Gastro-esophageal reflux disease without esophagitis: Secondary | ICD-10-CM | POA: Diagnosis not present

## 2020-06-21 MED ORDER — NA SULFATE-K SULFATE-MG SULF 17.5-3.13-1.6 GM/177ML PO SOLN: 354.0000 mL | Freq: Once | ORAL | 0 refills | Status: AC

## 2020-06-21 NOTE — Progress Notes (Signed)
Cephas Darby, MD [address]  [place]  [place], [place] [address]  Main: [phone]  Fax: [phone] Pager: [phone]   Primary Care Physician: Idelle Crouch, MD  Primary Gastroenterologist:  Dr. Cephas Darby  Chief Complaint  Patient presents with  . Follow-up    IBS    HPI: Amanda Miranda is a 44 y.o. female with history of IBS, chronic GERD.  Her GERD is fairly under control on omeprazole 20 mg once daily.  She underwent EGD which was unremarkable.  She reports that for last 6 months, she has been experiencing abdominal cramps associated with watery bowel movements, up to 3 times per day between 10 AM to noon.  She is also experiencing abdominal cramps after intercourse with urge to have a BM.  She denies any dyspareunia.  She denies any weight loss.  She does report abdominal bloating.  She denies rectal bleeding.  She was evaluated by her gynecologist, etiology was not found and therefore was advised to follow-up with GI.  Patient has tried Bentyl in the past, unsure of the benefit.  She is not taking any medications for IBS at this time.  Patient admits to drinking ginger ale, 2 L of bottle last for 3 days.  She also reports that several years ago, when she was having IBS symptoms, she was advised to cut back on white sugar and sweeteners which she did and her symptoms were relieved.  She was also drinking diet Coke and switched to ginger ale.  Current Outpatient Medications  Medication Sig Dispense Refill  . cyclobenzaprine (FLEXERIL) 5 MG tablet Take by mouth.    . EPINEPHrine 0.3 mg/0.3 mL IJ SOAJ injection Inject into the skin.    Eduard Roux (AIMOVIG) 140 MG/ML SOAJ Inject 1 Dose into the skin every 30 (thirty) days.    Marland Kitchen levonorgestrel (MIRENA, 52 MG,) 20 MCG/24HR IUD Mirena 20 mcg/24 hours (5 yrs) 52 mg intrauterine device  Take by intrauterine route.    Marland Kitchen omeprazole (PRILOSEC OTC) 20 MG tablet Take by mouth.    . ondansetron (ZOFRAN-ODT) 4 MG  disintegrating tablet Take by mouth.    . propranolol (INDERAL) 10 MG tablet Take 10 mg by mouth 2 (two) times daily.    . Sod Fluoride-Potassium Nitrate (PREVIDENT 5000 SENSITIVE) 1.1-5 % PSTE Prevident 5000 Enamel Protect 1.1 %-5 % dental paste    . traZODone (DESYREL) 50 MG tablet trazodone 50 mg tablet  TAKE 1 TABLET BY MOUTH NIGHTLY AS NEEDED FOR SLEEP    . aluminum chloride (DRYSOL) 20 % external solution Drysol Dab-O-Matic 20 % topical solution (Patient not taking: Reported on 06/21/2020)    . dicyclomine (BENTYL) 10 MG capsule Take 1 capsule (10 mg total) by mouth 3 (three) times daily before meals for 30 doses. As needed 30 capsule 0  . Na Sulfate-K Sulfate-Mg Sulf 17.5-3.13-1.6 GM/177ML SOLN Take 354 mLs by mouth once for 1 dose. 354 mL 0  . rizatriptan (MAXALT) 5 MG tablet rizatriptan 5 mg tablet (Patient not taking: Reported on 06/21/2020)    . triamcinolone cream (KENALOG) 0.1 % triamcinolone acetonide 0.1 % topical cream  APPLY TO AFFECTED NON-FACIAL RASH TWICE DAILY UNTIL CLEAR (Patient not taking: Reported on 06/21/2020)    . valACYclovir (VALTREX) 500 MG tablet valacyclovir 500 mg tablet  TAKE 1 TABLET BY MOUTH ONCE DAILY (Patient not taking: Reported on 06/21/2020)     No current facility-administered medications for this visit.    Allergies as of  06/21/2020 - Review Complete 06/21/2020  Allergen Reaction Noted  . Secnidazole Anaphylaxis 02/05/2018  . Sumatriptan succinate Other (See Comments) 03/07/2017  . Zolmitriptan Other (See Comments) 03/07/2017    NSAIDs: None  Antiplts/Anticoagulants/Anti thrombotics: None  GI procedures: Upper endoscopy 09/03/2018 - Normal duodenal bulb and second portion of the duodenum. Biopsied. - Normal stomach. Biopsied. - Esophagogastric landmarks identified. - Normal gastroesophageal junction and esophagus. Biopsied.  DIAGNOSIS:  A. DUODENUM; COLD BIOPSY:  - DUODENAL MUCOSA WITH INTACT VILLI.  - NEGATIVE FOR ACTIVE INFLAMMATION,  INTRAEPITHELIAL LYMPHOCYTOSIS, AND  INFECTIOUS AGENTS.   B. STOMACH; RANDOM COLD BIOPSY:  - OXYNTIC MUCOSA WITHOUT PATHOLOGIC CHANGES.  - NEGATIVE FOR H. PYLORI, INTESTINAL METAPLASIA, DYSPLASIA, AND  MALIGNANCY.   C. ESOPHAGUS, DISTAL; COLD BIOPSY:  - STRATIFIED SQUAMOUS EPITHELIUM WITHOUT EOSINOPHILS, NEUTROPHILS, OR  REACTIVE CHANGES.  - NEGATIVE FOR DYSPLASIA AND MALIGNANCY.   D. ESOPHAGUS, PROXIMAL; COLD BIOPSY:  - STRATIFIED SQUAMOUS EPITHELIUM WITHOUT EOSINOPHILS, NEUTROPHILS, OR  REACTIVE CHANGES.  - NEGATIVE FOR DYSPLASIA AND MALIGNANCY.  ROS:  General: Negative for anorexia, weight loss, fever, chills, fatigue, weakness. ENT: Negative for hoarseness, difficulty swallowing , nasal congestion. CV: Negative for chest pain, angina, palpitations, dyspnea on exertion, peripheral edema.  Respiratory: Negative for dyspnea at rest, dyspnea on exertion, cough, sputum, wheezing.  GI: See history of present illness. GU:  Negative for dysuria, hematuria, urinary incontinence, urinary frequency, nocturnal urination.  Endo: Negative for unusual weight change.    Physical Examination:   BP 106/75 (BP Location: Left Arm, Patient Position: Sitting, Cuff Size: Normal)   Pulse 76   Temp 98.4 F (36.9 C) (Oral)   Ht 5\' 6"  (1.676 m)   Wt 138 lb 3.2 oz (62.7 kg)   BMI 22.31 kg/m   General: Well-nourished, well-developed in no acute distress.  Eyes: No icterus. Conjunctivae pink. Mouth: Oropharyngeal mucosa moist and pink , no lesions erythema or exudate. Lungs: Clear to auscultation bilaterally. Non-labored. Heart: Regular rate and rhythm, no murmurs rubs or gallops.  Abdomen: Bowel sounds are normal, nontender, mildly distended, no hepatosplenomegaly or masses, no hernia , no rebound or guarding.   Extremities: No lower extremity edema. No clubbing or deformities. Neuro: Alert and oriented x 3.  Grossly intact. Skin: Warm and dry, no jaundice.   Psych: Alert and cooperative,  normal mood and affect.   Imaging Studies: No results found.  Assessment and Plan:   Amanda Miranda is a 44 y.o. pleasant Caucasian female with history of chronic GERD, IBS diarrhea is seen in consultation for 6 months history of diarrhea predominant IBS.  Her diarrhea is most likely secondary to consumption of carbonated beverage.  Strongly advised her to stop and see if her diarrhea resolves.  No evidence of celiac disease, H. pylori negative.  If diarrhea persists, recommend stool studies to rule out infection  She is also due for colon cancer screening, schedule screening colonoscopy TI evaluation and colon biopsies  I have discussed alternative options, risks & benefits,  which include, but are not limited to, bleeding, infection, perforation,respiratory complication & drug reaction.  The patient agrees with this plan & written consent will be obtained.     Follow up if diarrhea persists   Dr Sherri Sear, MD

## 2020-06-23 ENCOUNTER — Telehealth: Payer: Self-pay | Admitting: Gastroenterology

## 2020-06-23 MED ORDER — CLENPIQ 10-3.5-12 MG-GM -GM/160ML PO SOLN: 320.0000 mL | Freq: Once | ORAL | 0 refills | Status: AC

## 2020-06-23 NOTE — Telephone Encounter (Signed)
Change patient to Clenpiq. It says this is covered by patient insurance.

## 2020-06-23 NOTE — Telephone Encounter (Signed)
[phone] from [place] called from [facility] [address] and stated that patient said $65 was to much for medicine. Clinical staff was informed.

## 2020-06-27 ENCOUNTER — Encounter: Payer: Self-pay | Admitting: Gastroenterology

## 2020-07-03 ENCOUNTER — Other Ambulatory Visit: Payer: Self-pay

## 2020-07-03 MED ORDER — PEG 3350-KCL-NA BICARB-NACL 420 G PO SOLR: 4000.0000 mL | Freq: Once | ORAL | 0 refills | Status: AC

## 2020-07-03 NOTE — Progress Notes (Signed)
[facility] is calling because patient states she will not pay 65 dollars for the Suprep or 95 dollars for the Clenpiq. They state they do have the Nultely in stock. Sent medication to pharmacy. Informed them to start at 5pm and drink 8oz every 20-30 minutes till gone

## 2020-07-03 NOTE — Discharge Instructions (Signed)
General Anesthesia, Adult, Care After This sheet gives you information about how to care for yourself after your procedure. Your health care provider may also give you more specific instructions. If you have problems or questions, contact your health care provider. What can I expect after the procedure? After the procedure, the following side effects are common:  Pain or discomfort at the IV site.  Nausea.  Vomiting.  Sore throat.  Trouble concentrating.  Feeling cold or chills.  Weak or tired.  Sleepiness and fatigue.  Soreness and body aches. These side effects can affect parts of the body that were not involved in surgery. Follow these instructions at home:  For at least 24 hours after the procedure:  Have a responsible adult stay with you. It is important to have someone help care for you until you are awake and alert.  Rest as needed.  Do not: ? Participate in activities in which you could fall or become injured. ? Drive. ? Use heavy machinery. ? Drink alcohol. ? Take sleeping pills or medicines that cause drowsiness. ? Make important decisions or sign legal documents. ? Take care of children on your own. Eating and drinking  Follow any instructions from your health care provider about eating or drinking restrictions.  When you feel hungry, start by eating small amounts of foods that are soft and easy to digest (bland), such as toast. Gradually return to your regular diet.  Drink enough fluid to keep your urine pale yellow.  If you vomit, rehydrate by drinking water, juice, or clear broth. General instructions  If you have sleep apnea, surgery and certain medicines can increase your risk for breathing problems. Follow instructions from your health care provider about wearing your sleep device: ? Anytime you are sleeping, including during daytime naps. ? While taking prescription pain medicines, sleeping medicines, or medicines that make you drowsy.  Return to  your normal activities as told by your health care provider. Ask your health care provider what activities are safe for you.  Take over-the-counter and prescription medicines only as told by your health care provider.  If you smoke, do not smoke without supervision.  Keep all follow-up visits as told by your health care provider. This is important. Contact a health care provider if:  You have nausea or vomiting that does not get better with medicine.  You cannot eat or drink without vomiting.  You have pain that does not get better with medicine.  You are unable to pass urine.  You develop a skin rash.  You have a fever.  You have redness around your IV site that gets worse. Get help right away if:  You have difficulty breathing.  You have chest pain.  You have blood in your urine or stool, or you vomit blood. Summary  After the procedure, it is common to have a sore throat or nausea. It is also common to feel tired.  Have a responsible adult stay with you for the first 24 hours after general anesthesia. It is important to have someone help care for you until you are awake and alert.  When you feel hungry, start by eating small amounts of foods that are soft and easy to digest (bland), such as toast. Gradually return to your regular diet.  Drink enough fluid to keep your urine pale yellow.  Return to your normal activities as told by your health care provider. Ask your health care provider what activities are safe for you. This information is not   intended to replace advice given to you by your health care provider. Make sure you discuss any questions you have with your health care provider. Document Revised: 09/05/2017 Document Reviewed: 04/18/2017 Elsevier Patient Education  2020 Elsevier Inc.  

## 2020-07-04 ENCOUNTER — Other Ambulatory Visit
Admission: RE | Admit: 2020-07-04 | Discharge: 2020-07-04 | Disposition: A | Discharge status: Home / Self Care | Payer: Managed Care, Other (non HMO) | Source: Ambulatory Visit | Attending: Gastroenterology | Admitting: Gastroenterology

## 2020-07-04 ENCOUNTER — Other Ambulatory Visit: Payer: Self-pay

## 2020-07-04 DIAGNOSIS — Z20822 Contact with and (suspected) exposure to covid-19: Secondary | ICD-10-CM | POA: Insufficient documentation

## 2020-07-04 DIAGNOSIS — Z01812 Encounter for preprocedural laboratory examination: Secondary | ICD-10-CM | POA: Diagnosis present

## 2020-07-04 LAB — SARS CORONAVIRUS 2 (TAT 6-24 HRS): SARS Coronavirus 2: NEGATIVE

## 2020-07-06 ENCOUNTER — Encounter
Admission: RE | Disposition: A | Discharge status: Home / Self Care | Payer: Self-pay | Source: Home / Self Care | Attending: Gastroenterology

## 2020-07-06 ENCOUNTER — Other Ambulatory Visit: Payer: Self-pay

## 2020-07-06 ENCOUNTER — Ambulatory Visit: Payer: Managed Care, Other (non HMO) | Admitting: Anesthesiology

## 2020-07-06 ENCOUNTER — Encounter: Payer: Self-pay | Admitting: Gastroenterology

## 2020-07-06 ENCOUNTER — Ambulatory Visit
Admission: RE | Admit: 2020-07-06 | Discharge: 2020-07-06 | Disposition: A | Discharge status: Home / Self Care | Payer: Managed Care, Other (non HMO) | Attending: Gastroenterology | Admitting: Gastroenterology

## 2020-07-06 DIAGNOSIS — K589 Irritable bowel syndrome without diarrhea: Secondary | ICD-10-CM | POA: Diagnosis not present

## 2020-07-06 DIAGNOSIS — K5730 Diverticulosis of large intestine without perforation or abscess without bleeding: Secondary | ICD-10-CM | POA: Diagnosis not present

## 2020-07-06 DIAGNOSIS — K644 Residual hemorrhoidal skin tags: Secondary | ICD-10-CM | POA: Diagnosis not present

## 2020-07-06 DIAGNOSIS — Z1211 Encounter for screening for malignant neoplasm of colon: Secondary | ICD-10-CM | POA: Insufficient documentation

## 2020-07-06 DIAGNOSIS — Z888 Allergy status to other drugs, medicaments and biological substances status: Secondary | ICD-10-CM | POA: Insufficient documentation

## 2020-07-06 DIAGNOSIS — Z79899 Other long term (current) drug therapy: Secondary | ICD-10-CM | POA: Diagnosis not present

## 2020-07-06 DIAGNOSIS — Z883 Allergy status to other anti-infective agents status: Secondary | ICD-10-CM | POA: Insufficient documentation

## 2020-07-06 DIAGNOSIS — K219 Gastro-esophageal reflux disease without esophagitis: Secondary | ICD-10-CM | POA: Insufficient documentation

## 2020-07-06 HISTORY — PX: COLONOSCOPY WITH PROPOFOL: SHX5780

## 2020-07-06 LAB — POCT PREGNANCY, URINE: Preg Test, Ur: NEGATIVE

## 2020-07-06 SURGERY — COLONOSCOPY WITH PROPOFOL
Anesthesia: General | Site: Rectum

## 2020-07-06 MED ORDER — LACTATED RINGERS IV SOLN: INTRAVENOUS | Status: DC | PRN

## 2020-07-06 MED ORDER — LIDOCAINE HCL (CARDIAC) PF 100 MG/5ML IV SOSY
PREFILLED_SYRINGE | INTRAVENOUS | Status: DC | PRN
  Administered 2020-07-06: 40 mg via INTRAVENOUS

## 2020-07-06 MED ORDER — STERILE WATER FOR IRRIGATION IR SOLN: Status: DC | PRN

## 2020-07-06 MED ORDER — PROPOFOL 10 MG/ML IV BOLUS
INTRAVENOUS | Status: DC | PRN
  Administered 2020-07-06 (×3): 20 mg via INTRAVENOUS
  Administered 2020-07-06: 40 mg via INTRAVENOUS
  Administered 2020-07-06: 20 mg via INTRAVENOUS
  Administered 2020-07-06: 60 mg via INTRAVENOUS

## 2020-07-06 SURGICAL SUPPLY — 7 items
FORCEPS BIOP RAD 4 LRG CAP 4 (CUTTING FORCEPS) ×3 IMPLANT
GOWN CVR UNV OPN BCK APRN NK (MISCELLANEOUS) ×2 IMPLANT
GOWN ISOL THUMB LOOP REG UNIV (MISCELLANEOUS) ×6
KIT PRC NS LF DISP ENDO (KITS) ×1 IMPLANT
KIT PROCEDURE OLYMPUS (KITS) ×3
MANIFOLD NEPTUNE II (INSTRUMENTS) ×3 IMPLANT
WATER STERILE IRR 250ML POUR (IV SOLUTION) ×3 IMPLANT

## 2020-07-06 NOTE — Transfer of Care (Signed)
Immediate Anesthesia Transfer of Care Note  Patient: Amanda Miranda  Procedure(s) Performed: COLONOSCOPY WITH BIOPSY (N/A Rectum)  Patient Location: PACU  Anesthesia Type: General  Level of Consciousness: awake, alert  and patient cooperative  Airway and Oxygen Therapy: Patient Spontanous Breathing and Patient connected to supplemental oxygen  Post-op Assessment: Post-op Vital signs reviewed, Patient's Cardiovascular Status Stable, Respiratory Function Stable, Patent Airway and No signs of Nausea or vomiting  Post-op Vital Signs: Reviewed and stable  Complications: No complications documented.

## 2020-07-06 NOTE — Anesthesia Procedure Notes (Signed)
Procedure Name: MAC Date/Time: 07/06/2020 8:45 AM Performed by: Silvana Newness, CRNA Pre-anesthesia Checklist: Patient identified, Emergency Drugs available, Suction available, Patient being monitored and Timeout performed Patient Re-evaluated:Patient Re-evaluated prior to induction Oxygen Delivery Method: Nasal cannula Placement Confirmation: positive ETCO2

## 2020-07-06 NOTE — Anesthesia Postprocedure Evaluation (Signed)
Anesthesia Post Note  Patient: Amanda Miranda  Procedure(s) Performed: COLONOSCOPY WITH BIOPSY (N/A Rectum)     Patient location during evaluation: PACU Anesthesia Type: General Level of consciousness: awake and alert Pain management: pain level controlled Vital Signs Assessment: post-procedure vital signs reviewed and stable Respiratory status: nonlabored ventilation and spontaneous breathing Cardiovascular status: stable Postop Assessment: no apparent nausea or vomiting Anesthetic complications: no   No complications documented.  [name]

## 2020-07-06 NOTE — Op Note (Signed)
[facility] Gastroenterology Patient Name: Amanda Miranda Procedure Date: 07/06/2020 8:30 AM MRN: [id number] Account #: [id number] Date of Birth: 08/08/1976 Admit Type: Outpatient Age: 44 Room: [facility] OR ROOM 01 Gender: Female Note Status: Finalized Procedure:             Colonoscopy Indications:           Screening for colorectal malignant neoplasm, This is                         the patient's first colonoscopy Providers:             Lin Landsman MD, MD Referring MD:          Leonie Douglas. Doy Hutching, MD (Referring MD) Medicines:             General Anesthesia Complications:         No immediate complications. Estimated blood loss: None. Procedure:             Pre-Anesthesia Assessment:                        - Prior to the procedure, a History and Physical was                         performed, and patient medications and allergies were                         reviewed. The patient is competent. The risks and                         benefits of the procedure and the sedation options and                         risks were discussed with the patient. All questions                         were answered and informed consent was obtained.                         Patient identification and proposed procedure were                         verified by the physician, the nurse, the                         anesthesiologist, the anesthetist and the technician                         in the pre-procedure area in the procedure room in the                         endoscopy suite. Mental Status Examination: alert and                         oriented. Airway Examination: normal oropharyngeal                         airway and neck mobility. Respiratory Examination:  clear to auscultation. CV Examination: normal.                         Prophylactic Antibiotics: The patient does not require                         prophylactic antibiotics. Prior Anticoagulants:  The                         patient has taken no previous anticoagulant or                         antiplatelet agents. ASA Grade Assessment: II - A                         patient with mild systemic disease. After reviewing                         the risks and benefits, the patient was deemed in                         satisfactory condition to undergo the procedure. The                         anesthesia plan was to use general anesthesia.                         Immediately prior to administration of medications,                         the patient was re-assessed for adequacy to receive                         sedatives. The heart rate, respiratory rate, oxygen                         saturations, blood pressure, adequacy of pulmonary                         ventilation, and response to care were monitored                         throughout the procedure. The physical status of the                         patient was re-assessed after the procedure.                        After obtaining informed consent, the colonoscope was                         passed under direct vision. Throughout the procedure,                         the patient's blood pressure, pulse, and oxygen                         saturations were monitored continuously. The  Colonoscope was introduced through the anus and                         advanced to the the terminal ileum, with                         identification of the appendiceal orifice and IC                         valve. The colonoscopy was performed without                         difficulty. The patient tolerated the procedure well.                         The quality of the bowel preparation was evaluated                         using the BBPS [facility] Bowel Preparation Scale) with                         scores of: Right Colon = 3, Transverse Colon = 3 and                         Left Colon = 3 (entire mucosa seen well with no                          residual staining, small fragments of stool or opaque                         liquid). The total BBPS score equals 9. Findings:      The perianal and digital rectal examinations were normal. Pertinent       negatives include normal sphincter tone and no palpable rectal lesions.      The terminal ileum appeared normal.      The colon (entire examined portion) appeared normal. Biopsies for       histology were taken with a cold forceps from the entire colon for       evaluation of microscopic colitis.      Multiple diverticula were found in the sigmoid colon.      Non-bleeding external hemorrhoids were found during retroflexion. The       hemorrhoids were small. Impression:            - The examined portion of the ileum was normal.                        - The entire examined colon is normal. Biopsied.                        - Diverticulosis in the sigmoid colon.                        - Non-bleeding external hemorrhoids. Recommendation:        - Discharge patient to home (with escort).                        - Resume previous diet today.                        -  Continue present medications.                        - Await pathology results.                        - Repeat colonoscopy in 10 years for screening                         purposes.                        - Return to my office as previously scheduled. Procedure Code(s):     --- Professional ---                        [id number], Colonoscopy, flexible; with biopsy, single or                         multiple Diagnosis Code(s):     --- Professional ---                        Z12.11, Encounter for screening for malignant neoplasm                         of colon                        K64.4, Residual hemorrhoidal skin tags                        K57.30, Diverticulosis of large intestine without                         perforation or abscess without bleeding CPT copyright 2019 American Medical Association. All  rights reserved. The codes documented in this report are preliminary and upon coder review may  be revised to meet current compliance requirements. Dr. Ulyess Mort Lin Landsman MD, MD 07/06/2020 9:07:03 AM This report has been signed electronically. Number of Addenda: 0 Note Initiated On: 07/06/2020 8:30 AM Scope Withdrawal Time: 0 hours 12 minutes 14 seconds  Total Procedure Duration: 0 hours 15 minutes 51 seconds  Estimated Blood Loss:  Estimated blood loss: none.      [facility]

## 2020-07-06 NOTE — H&P (Signed)
Cephas Darby, MD [address]  [place]  [place], [place] [address]  Main: [phone]  Fax: [phone] Pager: [phone]  Primary Care Physician:  Idelle Crouch, MD Primary Gastroenterologist:  Dr. Cephas Darby  Pre-Procedure History & Physical: HPI:  Amanda Miranda is a 44 y.o. female is here for an colonoscopy.   Past Medical History:  Diagnosis Date  . IBS (irritable bowel syndrome)   . Migraines     Past Surgical History:  Procedure Laterality Date  . ESOPHAGOGASTRODUODENOSCOPY (EGD) WITH PROPOFOL N/A 09/03/2018   Procedure: ESOPHAGOGASTRODUODENOSCOPY (EGD) WITH PROPOFOL;  Surgeon: Lin Landsman, MD;  Location: [facility];  Service: Gastroenterology;  Laterality: N/A;  . INCONTINENCE SURGERY  04/17/2018  . neck surgery    . OVARIAN CYST REMOVAL      Prior to Admission medications   Medication Sig Start Date End Date Taking? Authorizing Provider  cyclobenzaprine (FLEXERIL) 5 MG tablet Take by mouth. 03/31/20  Yes [provider]  Erenumab-aooe (AIMOVIG) 140 MG/ML SOAJ Inject 1 Dose into the skin every 30 (thirty) days.   Yes [provider]  levonorgestrel (MIRENA, 52 MG,) 20 MCG/24HR IUD Mirena 20 mcg/24 hours (5 yrs) 52 mg intrauterine device  Take by intrauterine route.   Yes [provider]  omeprazole (PRILOSEC OTC) 20 MG tablet Take by mouth. 06/11/17  Yes [provider]  ondansetron (ZOFRAN-ODT) 4 MG disintegrating tablet Take by mouth. 03/23/19  Yes [provider]  propranolol (INDERAL) 10 MG tablet Take 10 mg by mouth 2 (two) times daily. 12/28/19  Yes [provider]  traZODone (DESYREL) 50 MG tablet trazodone 50 mg tablet  TAKE 1 TABLET BY MOUTH NIGHTLY AS NEEDED FOR SLEEP 10/21/19  Yes [provider]  aluminum chloride (DRYSOL) 20 % external solution Drysol Dab-O-Matic 20 % topical solution Patient not taking: Reported on 06/21/2020    [provider]  EPINEPHrine  0.3 mg/0.3 mL IJ SOAJ injection Inject into the skin. 05/25/18   [provider]  Sod Fluoride-Potassium Nitrate (PREVIDENT 5000 SENSITIVE) 1.1-5 % PSTE Prevident 5000 Enamel Protect 1.1 %-5 % dental paste    [provider]    Allergies as of 06/21/2020 - Review Complete 06/21/2020  Allergen Reaction Noted  . Secnidazole Anaphylaxis 02/05/2018  . Sumatriptan succinate Other (See Comments) 03/07/2017  . Zolmitriptan Other (See Comments) 03/07/2017    Family History  Problem Relation Age of Onset  . Hypertension Mother   . COPD Father   . Breast cancer Neg Hx     Social History   Socioeconomic History  . Marital status: Single    Spouse name: Not on file  . Number of children: Not on file  . Years of education: Not on file  . Highest education level: Not on file  Occupational History  . Not on file  Tobacco Use  . Smoking status: Never Smoker  . Smokeless tobacco: Never Used  Vaping Use  . Vaping Use: Never used  Substance and Sexual Activity  . Alcohol use: Yes    Comment: rarely  . Drug use: No  . Sexual activity: Not on file  Other Topics Concern  . Not on file  Social History Narrative  . Not on file   Social Determinants of Health   Financial Resource Strain:   . Difficulty of Paying Living Expenses: Not on file  Food Insecurity:   . Worried About Charity fundraiser in the Last Year: Not on file  .  Ran Out of Food in the Last Year: Not on file  Transportation Needs:   . Lack of Transportation (Medical): Not on file  . Lack of Transportation (Non-Medical): Not on file  Physical Activity:   . Days of Exercise per Week: Not on file  . Minutes of Exercise per Session: Not on file  Stress:   . Feeling of Stress : Not on file  Social Connections:   . Frequency of Communication with Friends and Family: Not on file  . Frequency of Social Gatherings with Friends and Family: Not on file  . Attends Religious Services: Not on file  . Active  Member of Clubs or Organizations: Not on file  . Attends Archivist Meetings: Not on file  . Marital Status: Not on file  Intimate Partner Violence:   . Fear of Current or Ex-Partner: Not on file  . Emotionally Abused: Not on file  . Physically Abused: Not on file  . Sexually Abused: Not on file    Review of Systems: See HPI, otherwise negative ROS  Physical Exam: BP 114/69   Pulse 80   Temp 98.1 F (36.7 C)   Wt 61.7 kg   SpO2 100%   BMI 21.95 kg/m  General:   Alert,  pleasant and cooperative in NAD Head:  Normocephalic and atraumatic. Neck:  Supple; no masses or thyromegaly. Lungs:  Clear throughout to auscultation.    Heart:  Regular rate and rhythm. Abdomen:  Soft, nontender and nondistended. Normal bowel sounds, without guarding, and without rebound.   Neurologic:  Alert and  oriented x4;  grossly normal neurologically.  Impression/Plan: Amanda Miranda is here for an colonoscopy to be performed for colon cancer screening  Risks, benefits, limitations, and alternatives regarding  colonoscopy have been reviewed with the patient.  Questions have been answered.  All parties agreeable.   Sherri Sear, MD  07/06/2020, 8:06 AM

## 2020-07-06 NOTE — Anesthesia Preprocedure Evaluation (Signed)
Anesthesia Evaluation  Patient identified by MRN, date of birth, ID band Patient awake    Reviewed: Allergy & Precautions, NPO status , Patient's Chart, lab work & pertinent test results  Airway Mallampati: I  TM Distance: >3 FB Neck ROM: Full    Dental no notable dental hx.    Pulmonary neg pulmonary ROS,    Pulmonary exam normal        Cardiovascular negative cardio ROS Normal cardiovascular exam     Neuro/Psych  Headaches, negative psych ROS   GI/Hepatic Neg liver ROS, GERD  Medicated and Controlled,  Endo/Other  negative endocrine ROS  Renal/GU negative Renal ROS     Musculoskeletal negative musculoskeletal ROS (+)   Abdominal Normal abdominal exam  (+)   Peds  Hematology negative hematology ROS (+)   Anesthesia Other Findings   Reproductive/Obstetrics                             Anesthesia Physical Anesthesia Plan  ASA: II  Anesthesia Plan: General   Post-op Pain Management:    Induction: Intravenous  PONV Risk Score and Plan: 3 and Propofol infusion, TIVA and Treatment may vary due to age or medical condition  Airway Management Planned: Natural Airway  Additional Equipment:   Intra-op Plan:   Post-operative Plan:   Informed Consent: I have reviewed the patients History and Physical, chart, labs and discussed the procedure including the risks, benefits and alternatives for the proposed anesthesia with the patient or authorized representative who has indicated his/her understanding and acceptance.     Dental advisory given  Plan Discussed with: CRNA  Anesthesia Plan Comments:         Anesthesia Quick Evaluation

## 2020-07-07 ENCOUNTER — Encounter: Payer: Self-pay | Admitting: Gastroenterology

## 2020-07-10 LAB — SURGICAL PATHOLOGY

## 2020-08-15 ENCOUNTER — Other Ambulatory Visit: Payer: Self-pay | Admitting: Obstetrics and Gynecology

## 2020-08-15 DIAGNOSIS — Z1231 Encounter for screening mammogram for malignant neoplasm of breast: Secondary | ICD-10-CM

## 2020-09-25 ENCOUNTER — Ambulatory Visit
Admission: RE | Admit: 2020-09-25 | Discharge: 2020-09-25 | Disposition: A | Discharge status: Home / Self Care | Payer: Managed Care, Other (non HMO) | Source: Ambulatory Visit | Attending: Obstetrics and Gynecology | Admitting: Obstetrics and Gynecology

## 2020-09-25 ENCOUNTER — Other Ambulatory Visit: Payer: Self-pay

## 2020-09-25 DIAGNOSIS — Z1231 Encounter for screening mammogram for malignant neoplasm of breast: Secondary | ICD-10-CM | POA: Insufficient documentation

## 2020-10-16 ENCOUNTER — Other Ambulatory Visit: Payer: Self-pay | Admitting: Obstetrics & Gynecology

## 2020-10-16 DIAGNOSIS — Z1231 Encounter for screening mammogram for malignant neoplasm of breast: Secondary | ICD-10-CM

## 2021-01-31 ENCOUNTER — Ambulatory Visit (INDEPENDENT_AMBULATORY_CARE_PROVIDER_SITE_OTHER): Payer: Managed Care, Other (non HMO) | Admitting: Dermatology

## 2021-01-31 ENCOUNTER — Other Ambulatory Visit: Payer: Self-pay

## 2021-01-31 DIAGNOSIS — L719 Rosacea, unspecified: Secondary | ICD-10-CM | POA: Diagnosis not present

## 2021-01-31 DIAGNOSIS — Z1283 Encounter for screening for malignant neoplasm of skin: Secondary | ICD-10-CM | POA: Diagnosis not present

## 2021-01-31 DIAGNOSIS — D229 Melanocytic nevi, unspecified: Secondary | ICD-10-CM

## 2021-01-31 DIAGNOSIS — L578 Other skin changes due to chronic exposure to nonionizing radiation: Secondary | ICD-10-CM

## 2021-01-31 DIAGNOSIS — D225 Melanocytic nevi of trunk: Secondary | ICD-10-CM | POA: Diagnosis not present

## 2021-01-31 DIAGNOSIS — L821 Other seborrheic keratosis: Secondary | ICD-10-CM

## 2021-01-31 DIAGNOSIS — L74519 Primary focal hyperhidrosis, unspecified: Secondary | ICD-10-CM

## 2021-01-31 DIAGNOSIS — L304 Erythema intertrigo: Secondary | ICD-10-CM

## 2021-01-31 DIAGNOSIS — D492 Neoplasm of unspecified behavior of bone, soft tissue, and skin: Secondary | ICD-10-CM

## 2021-01-31 DIAGNOSIS — L814 Other melanin hyperpigmentation: Secondary | ICD-10-CM

## 2021-01-31 DIAGNOSIS — D1800 Hemangioma unspecified site: Secondary | ICD-10-CM

## 2021-01-31 DIAGNOSIS — L74510 Primary focal hyperhidrosis, axilla: Secondary | ICD-10-CM | POA: Diagnosis not present

## 2021-01-31 HISTORY — DX: Melanocytic nevi, unspecified: D22.9

## 2021-01-31 MED ORDER — QBREXZA 2.4 % EX PADS: MEDICATED_PAD | CUTANEOUS | 2 refills | Status: DC

## 2021-01-31 NOTE — Progress Notes (Signed)
Follow-Up Visit   Subjective  Amanda Miranda is a 45 y.o. female who presents for the following: Annual Exam (Mole check ). Pt c/o irritated rash beneath her arms for several months  And refill Rosacea triple cream for rosacea  The patient presents for Total-Body Skin Exam (TBSE) for skin cancer screening and mole check.  The following portions of the chart were reviewed this encounter and updated as appropriate:   Tobacco  Allergies  Meds  Problems  Med Hx  Surg Hx  Fam Hx     Review of Systems:  No other skin or systemic complaints except as noted in HPI or Assessment and Plan.  Objective  Well appearing patient in no apparent distress; mood and affect are within normal limits.  A full examination was performed including scalp, head, eyes, ears, nose, lips, neck, chest, axillae, abdomen, back, buttocks, bilateral upper extremities, bilateral lower extremities, hands, feet, fingers, toes, fingernails, and toenails. All findings within normal limits unless otherwise noted below.  Objective  axillary area: Mild erythema  Objective  axillary area: Clear today   Objective  left low back paraspinal: 0.4 cm irregular brown macule   Objective  Head - Anterior (Face): Mid face erythema with telangiectasias +/- scattered inflammatory papules.    Assessment & Plan  Erythema intertrigo axillary area Intertrigo is a chronic recurrent rash that occurs in skin fold areas that may be associated with friction; heat; moisture; yeast; fungus; and bacteria.  It is exacerbated by increased movement / activity; sweating; and higher atmospheric temperature.   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  Once Intertrigo is clear  Start Qbrexa apply to axillary area once a day   Neoplasm of skin left low back paraspinal Epidermal / dermal shaving  Lesion diameter (cm):  0.4 Informed consent: discussed and consent  obtained   Timeout: patient name, date of birth, surgical site, and procedure verified   Procedure prep:  Patient was prepped and draped in usual sterile fashion Prep type:  Isopropyl alcohol Anesthesia: the lesion was anesthetized in a standard fashion   Anesthetic:  1% lidocaine w/ epinephrine 1-100,000 buffered w/ 8.4% NaHCO3 Hemostasis achieved with: pressure, aluminum chloride and electrodesiccation   Outcome: patient tolerated procedure well   Post-procedure details: sterile dressing applied and wound care instructions given   Dressing type: bandage and petrolatum    Other Related Procedures Anatomic Pathology Report  Rosacea Head - Anterior (Face) Rosacea is a chronic progressive skin condition usually affecting the face of adults, causing redness and/or acne bumps. It is treatable but not curable. It sometimes affects the eyes (ocular rosacea) as well. It may respond to topical and/or systemic medication and can flare with stress, sun exposure, alcohol, exercise and some foods.  Daily application of broad spectrum spf 30+ sunscreen to face is recommended to reduce flares.   Cont Rosacea triple cream apply to face daily   Lentigines - Scattered tan macules - Due to sun exposure - Benign-appering, observe - Recommend daily broad spectrum sunscreen SPF 30+ to sun-exposed areas, reapply every 2 hours as needed. - Call for any changes  Seborrheic Keratoses - Stuck-on, waxy, tan-brown papules and/or plaques  - Benign-appearing - Discussed benign etiology and prognosis. - Observe - Call for any changes  Melanocytic Nevi - Tan-brown and/or pink-flesh-colored symmetric macules and papules - Benign appearing on exam today - Observation - Call clinic for new or changing moles - Recommend daily  use of broad spectrum spf 30+ sunscreen to sun-exposed areas.   Hemangiomas - Red papules - Discussed benign nature - Observe - Call for any changes  Actinic Damage - Chronic  condition, secondary to cumulative UV/sun exposure - diffuse scaly erythematous macules with underlying dyspigmentation - Recommend daily broad spectrum sunscreen SPF 30+ to sun-exposed areas, reapply every 2 hours as needed.  - Staying in the shade or wearing long sleeves, sun glasses (UVA+UVB protection) and wide brim hats (4-inch brim around the entire circumference of the hat) are also recommended for sun protection.  - Call for new or changing lesions.  Skin cancer screening performed today.  Return in about 1 year (around 01/31/2022) for TBSE .  IMarye Round, CMA, am acting as scribe for Sarina Ser, MD .  Documentation: I have reviewed the above documentation for accuracy and completeness, and I agree with the above.  Sarina Ser, MD

## 2021-01-31 NOTE — Patient Instructions (Addendum)
If you have any questions or concerns for your doctor, please call our main line at [phone] and press option 4 to reach your doctor's medical assistant. If no one answers, please leave a voicemail as directed and we will return your call as soon as possible. Messages left after 4 pm will be answered the following business day.   You may also send us a message via MyChart. We typically respond to MyChart messages within 1-2 business days.  For prescription refills, please ask your pharmacy to contact our office. Our fax number is [phone].  If you have an urgent issue when the clinic is closed that cannot wait until the next business day, you can page your doctor at the number below.    Please note that while we do our best to be available for urgent issues outside of office hours, we are not available 24/7.   If you have an urgent issue and are unable to reach us, you may choose to seek medical care at your doctor's office, retail clinic, urgent care center, or emergency room.  If you have a medical emergency, please immediately call 911 or go to the emergency department.  Pager Numbers  - Dr. Kowalski: [phone]  - Dr. Moye: [phone]  - Dr. Stewart: [phone]  In the event of inclement weather, please call our main line at [phone] for an update on the status of any delays or closures.  Dermatology Medication Tips: Please keep the boxes that topical medications come in in order to help keep track of the instructions about where and how to use these. Pharmacies typically print the medication instructions only on the boxes and not directly on the medication tubes.   If your medication is too expensive, please contact our office at [phone] option 4 or send us a message through MyChart.   We are unable to tell what your co-pay for medications will be in advance as this is different depending on your insurance coverage. However, we may be able to find a substitute  medication at lower cost or fill out paperwork to get insurance to cover a needed medication.   If a prior authorization is required to get your medication covered by your insurance company, please allow us 1-2 business days to complete this process.  Drug prices often vary depending on where the prescription is filled and some pharmacies may offer cheaper prices.  The website www.goodrx.com contains coupons for medications through different pharmacies. The prices here do not account for what the cost may be with help from insurance (it may be cheaper with your insurance), but the website can give you the price if you did not use any insurance.  - You can print the associated coupon and take it with your prescription to the pharmacy.  - You may also stop by our office during regular business hours and pick up a GoodRx coupon card.  - If you need your prescription sent electronically to a different pharmacy, notify our office through [place] MyChart or by phone at [phone] option 4.   Instructions for [facility] Medications  One or more of your medications was sent to the [facility] mail order compounding pharmacy. You will receive an email from them and can purchase the medicine through that link. It will then be mailed to your home at the address you confirmed. If for any reason you do not receive an email from them, please check your spam folder. If you still do not find the   email, please let us know. [facility] phone number is [phone].  Wound Care Instructions  1. Cleanse wound gently with soap and water once a day then pat dry with clean gauze. Apply a thing coat of Petrolatum (petroleum jelly, "Vaseline") over the wound (unless you have an allergy to this). We recommend that you use a new, sterile tube of Vaseline. Do not pick or remove scabs. Do not remove the yellow or white "healing tissue" from the base of the wound.  2. Cover the wound with fresh, clean,  nonstick gauze and secure with paper tape. You may use Band-Aids in place of gauze and tape if the would is small enough, but would recommend trimming much of the tape off as there is often too much. Sometimes Band-Aids can irritate the skin.  3. You should call the office for your biopsy report after 1 week if you have not already been contacted.  4. If you experience any problems, such as abnormal amounts of bleeding, swelling, significant bruising, significant pain, or evidence of infection, please call the office immediately.  5. FOR ADULT SURGERY PATIENTS: If you need something for pain relief you may take 1 extra strength Tylenol (acetaminophen) AND 2 Ibuprofen (200mg each) together every 4 hours as needed for pain. (do not take these if you are allergic to them or if you have a reason you should not take them.) Typically, you may only need pain medication for 1 to 3 days.     

## 2021-02-09 ENCOUNTER — Encounter: Payer: Self-pay | Admitting: Dermatology

## 2021-02-13 LAB — ANATOMIC PATHOLOGY REPORT

## 2021-02-15 ENCOUNTER — Telehealth: Payer: Self-pay

## 2021-02-15 NOTE — Telephone Encounter (Signed)
Discussed biopsy results with pt  °

## 2021-02-15 NOTE — Telephone Encounter (Signed)
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    Dysplastic Mild Recheck next visit

## 2021-09-24 ENCOUNTER — Other Ambulatory Visit: Payer: Self-pay | Admitting: Obstetrics & Gynecology

## 2021-09-24 DIAGNOSIS — Z1231 Encounter for screening mammogram for malignant neoplasm of breast: Secondary | ICD-10-CM

## 2021-10-31 ENCOUNTER — Ambulatory Visit
Admission: RE | Admit: 2021-10-31 | Discharge: 2021-10-31 | Disposition: A | Discharge status: Home / Self Care | Payer: Managed Care, Other (non HMO) | Source: Ambulatory Visit | Attending: Obstetrics & Gynecology | Admitting: Obstetrics & Gynecology

## 2021-10-31 ENCOUNTER — Other Ambulatory Visit: Payer: Self-pay

## 2021-10-31 DIAGNOSIS — Z1231 Encounter for screening mammogram for malignant neoplasm of breast: Secondary | ICD-10-CM | POA: Insufficient documentation

## 2022-01-31 ENCOUNTER — Ambulatory Visit (INDEPENDENT_AMBULATORY_CARE_PROVIDER_SITE_OTHER): Payer: Managed Care, Other (non HMO) | Admitting: Dermatology

## 2022-01-31 DIAGNOSIS — L820 Inflamed seborrheic keratosis: Secondary | ICD-10-CM | POA: Diagnosis not present

## 2022-01-31 DIAGNOSIS — L578 Other skin changes due to chronic exposure to nonionizing radiation: Secondary | ICD-10-CM

## 2022-01-31 DIAGNOSIS — L821 Other seborrheic keratosis: Secondary | ICD-10-CM | POA: Diagnosis not present

## 2022-01-31 DIAGNOSIS — Z1283 Encounter for screening for malignant neoplasm of skin: Secondary | ICD-10-CM | POA: Diagnosis not present

## 2022-01-31 DIAGNOSIS — L814 Other melanin hyperpigmentation: Secondary | ICD-10-CM

## 2022-01-31 DIAGNOSIS — D1800 Hemangioma unspecified site: Secondary | ICD-10-CM

## 2022-01-31 DIAGNOSIS — Z86018 Personal history of other benign neoplasm: Secondary | ICD-10-CM

## 2022-01-31 DIAGNOSIS — D229 Melanocytic nevi, unspecified: Secondary | ICD-10-CM

## 2022-01-31 NOTE — Patient Instructions (Signed)
Cryotherapy Aftercare  Wash gently with soap and water everyday.   Apply Vaseline and Band-Aid daily until healed.    Melanoma ABCDEs  Melanoma is the most dangerous type of skin cancer, and is the leading cause of death from skin disease.  You are more likely to develop melanoma if you: Have light-colored skin, light-colored eyes, or red or blond hair Spend a lot of time in the sun Tan regularly, either outdoors or in a tanning bed Have had blistering sunburns, especially during childhood Have a close family member who has had a melanoma Have atypical moles or large birthmarks  Early detection of melanoma is key since treatment is typically straightforward and cure rates are extremely high if we catch it early.   The first sign of melanoma is often a change in a mole or a new dark spot.  The ABCDE system is a way of remembering the signs of melanoma.  A for asymmetry:  The two halves do not match. B for border:  The edges of the growth are irregular. C for color:  A mixture of colors are present instead of an even brown color. D for diameter:  Melanomas are usually (but not always) greater than 15m - the size of a pencil eraser. E for evolution:  The spot keeps changing in size, shape, and color.  Please check your skin once per month between visits. You can use a small mirror in front and a large mirror behind you to keep an eye on the back side or your body.   If you see any new or changing lesions before your next follow-up, please call to schedule a visit.  Please continue daily skin protection including broad spectrum sunscreen SPF 30+ to sun-exposed areas, reapplying every 2 hours as needed when you're outdoors.    If You Need Anything After Your Visit  If you have any questions or concerns for your doctor, please call our main line at [phone] press option 4 to reach your doctor's medical assistant. If no one answers, please leave a voicemail as directed and we will  return your call as soon as possible. Messages left after 4 pm will be answered the following business day.   You may also send [place] message via [facility] We typically respond to MyChart messages within 1-2 business days.  For prescription refills, please ask your pharmacy to contact our office. Our fax number is [phone]  If you have an urgent issue when the clinic is closed that cannot wait until the next business day, you can page your doctor at the number below.    Please note that while we do our best to be available for urgent issues outside of office hours, we are not available 24/7.   If you have an urgent issue and are unable to reach [place] you may choose to seek medical care at your doctor's office, retail clinic, urgent care center, or emergency room.  If you have a medical emergency, please immediately call 911 or go to the emergency department.  Pager Numbers  - Dr. KNehemiah Massed [phone] - Dr. MLaurence Ferrari [phone] - Dr. SNicole Kindred [phone] In the event of inclement weather, please call our main line at [phone] an update on the status of any delays or closures.  Dermatology Medication Tips: Please keep the boxes that topical medications come in in order to help keep track of the instructions about where and how to use these. Pharmacies typically print the medication  instructions only on the boxes and not directly on the medication tubes.   If your medication is too expensive, please contact our office at [phone] option 4 or send [place] a message through [place].   We are unable to tell what your co-pay for medications will be in advance as this is different depending on your insurance coverage. However, we may be able to find a substitute medication at lower cost or fill out paperwork to get insurance to cover a needed medication.   If a prior authorization is required to get your medication covered by your insurance company, please allow [place] 1-2 business  days to complete this process.  Drug prices often vary depending on where the prescription is filled and some pharmacies may offer cheaper prices.  The website www.goodrx.com contains coupons for medications through different pharmacies. The prices here do not account for what the cost may be with help from insurance (it may be cheaper with your insurance), but the website can give you the price if you did not use any insurance.  - You can print the associated coupon and take it with your prescription to the pharmacy.  - You may also stop by our office during regular business hours and pick up a GoodRx coupon card.  - If you need your prescription sent electronically to a different pharmacy, notify our office through [facility] or by phone at [phone] option 4.     Si Usted Necesita Algo Despus de Su Visita  Tambin puede enviarnos un mensaje a travs de Pharmacist, community. Por lo general respondemos a los mensajes de MyChart en el transcurso de 1 a 2 das hbiles.  Para renovar recetas, por favor pida a su farmacia que se ponga en contacto con nuestra oficina. Harland Dingwall de fax es [place] [phone].  Si tiene un asunto urgente cuando la clnica est cerrada y que no puede esperar hasta el siguiente da hbil, puede llamar/localizar a su doctor(a) al nmero que aparece a continuacin.   Por favor, tenga en cuenta que aunque hacemos todo lo posible para estar disponibles para asuntos urgentes fuera del horario de [place], no estamos disponibles las 24 horas del da, los 7 das de la [place].   Si tiene un problema urgente y no puede comunicarse con nosotros, puede optar por buscar atencin mdica  en el consultorio de su doctor(a), en una clnica privada, en un centro de atencin urgente o en una sala de emergencias.  Si tiene Engineering geologist, por favor llame inmediatamente al 911 o vaya a la sala de emergencias.  Nmeros de bper  - Dr. Nehemiah Massed: [phone]  - Dra. Moye:  [phone]  - Dra. Nicole Kindred: [phone]  En caso de inclemencias del [place], por favor llame a Johnsie Kindred principal al [phone] para una actualizacin sobre el [place] de cualquier retraso o cierre.  Consejos para la medicacin en dermatologa: Por favor, guarde las cajas en las que vienen los medicamentos de uso tpico para ayudarle a seguir las instrucciones sobre dnde y cmo usarlos. Las farmacias generalmente imprimen las instrucciones del medicamento slo en las cajas y no directamente en los tubos del [place].   Si su medicamento es muy caro, por favor, pngase en contacto con Zigmund Daniel llamando al [phone] y presione la opcin 4 o envenos un mensaje a travs de Pharmacist, community.   No podemos decirle cul ser su copago por los medicamentos por adelantado ya que esto es diferente dependiendo de la cobertura de su seguro. Sin embargo,  es posible que podamos encontrar un medicamento sustituto a Electrical engineer un formulario para que el seguro cubra el medicamento que se considera necesario.   Si se requiere una autorizacin previa para que su compaa de seguros [place] su medicamento, por favor permtanos de 1 a 2 das hbiles para completar este proceso.  Los precios de los medicamentos varan con frecuencia dependiendo del Environmental consultant de dnde se surte la receta y alguna farmacias pueden ofrecer precios ms baratos.  El sitio web www.goodrx.com tiene cupones para medicamentos de Airline pilot. Los precios aqu no tienen en cuenta lo que podra costar con la ayuda del seguro (puede ser ms barato con su seguro), pero el sitio web puede darle el precio si no utiliz Research scientist (physical sciences).  - Puede imprimir el cupn correspondiente y llevarlo con su receta a la farmacia.  - Tambin puede pasar por nuestra oficina durante el horario de atencin regular y Charity fundraiser una tarjeta de cupones de GoodRx.  - Si necesita que su receta se enve electrnicamente a una farmacia diferente,  informe a nuestra oficina a travs de MyChart de [place] o por telfono llamando al [phone] y presione la opcin 4.

## 2022-01-31 NOTE — Progress Notes (Signed)
Follow-Up Visit   Subjective  Amanda Miranda is a 46 y.o. female who presents for the following: Annual Exam (The patient presents for Total-Body Skin Exam (TBSE) for skin cancer screening and mole check.  The patient has spots, moles and lesions to be evaluated, some may be new or changing and the patient has concerns that these could be cancer. Patient with hx of dysplastic nevus. She c/o an itchy spot at right neck, present for years. ).  The following portions of the chart were reviewed this encounter and updated as appropriate:   Tobacco  Allergies  Meds  Problems  Med Hx  Surg Hx  Fam Hx     Review of Systems:  No other skin or systemic complaints except as noted in HPI or Assessment and Plan.  Objective  Well appearing patient in no apparent distress; mood and affect are within normal limits.  A full examination was performed including scalp, head, eyes, ears, nose, lips, neck, chest, axillae, abdomen, back, buttocks, bilateral upper extremities, bilateral lower extremities, hands, feet, fingers, toes, fingernails, and toenails. All findings within normal limits unless otherwise noted below.  right lateral base of neck supraclavicular x 1, left chest x 2, left post thigh x 1 (4) Erythematous stuck-on, waxy papule or plaque   Assessment & Plan  Inflamed seborrheic keratosis (4) right lateral base of neck supraclavicular x 1, left chest x 2, left post thigh x 1 Symptomatic, irritating, patient would like treated.  Destruction of lesion - right lateral base of neck supraclavicular x 1, left chest x 2, left post thigh x 1 Complexity: simple   Destruction method: cryotherapy   Informed consent: discussed and consent obtained   Timeout:  patient name, date of birth, surgical site, and procedure verified Lesion destroyed using liquid nitrogen: Yes   Region frozen until ice ball extended beyond lesion: Yes   Outcome: patient tolerated procedure well with no complications    Post-procedure details: wound care instructions given    Lentigines - Scattered tan macules - Due to sun exposure - Benign-appearing, observe - Recommend daily broad spectrum sunscreen SPF 30+ to sun-exposed areas, reapply every 2 hours as needed. - Call for any changes  Seborrheic Keratoses - Stuck-on, waxy, tan-brown papules and/or plaques  - Benign-appearing - Discussed benign etiology and prognosis. - Observe - Call for any changes  Melanocytic Nevi - Tan-brown and/or pink-flesh-colored symmetric macules and papules - Benign appearing on exam today - Observation - Call clinic for new or changing moles - Recommend daily use of broad spectrum spf 30+ sunscreen to sun-exposed areas.   Hemangiomas - Red papules - Discussed benign nature - Observe - Call for any changes  Actinic Damage - Chronic condition, secondary to cumulative UV/sun exposure - diffuse scaly erythematous macules with underlying dyspigmentation - Recommend daily broad spectrum sunscreen SPF 30+ to sun-exposed areas, reapply every 2 hours as needed.  - Staying in the shade or wearing long sleeves, sun glasses (UVA+UVB protection) and wide brim hats (4-inch brim around the entire circumference of the hat) are also recommended for sun protection.  - Call for new or changing lesions.  Skin cancer screening performed today.  History of Dysplastic Nevi - No evidence of recurrence today - Recommend regular full body skin exams - Recommend daily broad spectrum sunscreen SPF 30+ to sun-exposed areas, reapply every 2 hours as needed.  - Call if any new or changing lesions are noted between office visits  Return in about 1 year (  around 02/01/2023) for TBSE.  Graciella Belton, RMA, am acting as scribe for Sarina Ser, MD . Documentation: I have reviewed the above documentation for accuracy and completeness, and I agree with the above.  Sarina Ser, MD

## 2022-02-10 ENCOUNTER — Encounter: Payer: Self-pay | Admitting: Dermatology

## 2022-08-06 IMAGING — MG MM DIGITAL SCREENING BILAT W/ TOMO AND CAD
8 series · 9 of 24 positions shown · non-contrast
Comparison: Previous exam(s).

CLINICAL DATA: Screening.

EXAM:
DIGITAL SCREENING BILATERAL MAMMOGRAM WITH TOMOSYNTHESIS AND CAD
TECHNIQUE: Bilateral screening digital craniocaudal and mediolateral oblique
mammograms were obtained. Bilateral screening digital breast
tomosynthesis was performed. The images were evaluated with
computer-aided detection.

[R CC synth-2D]
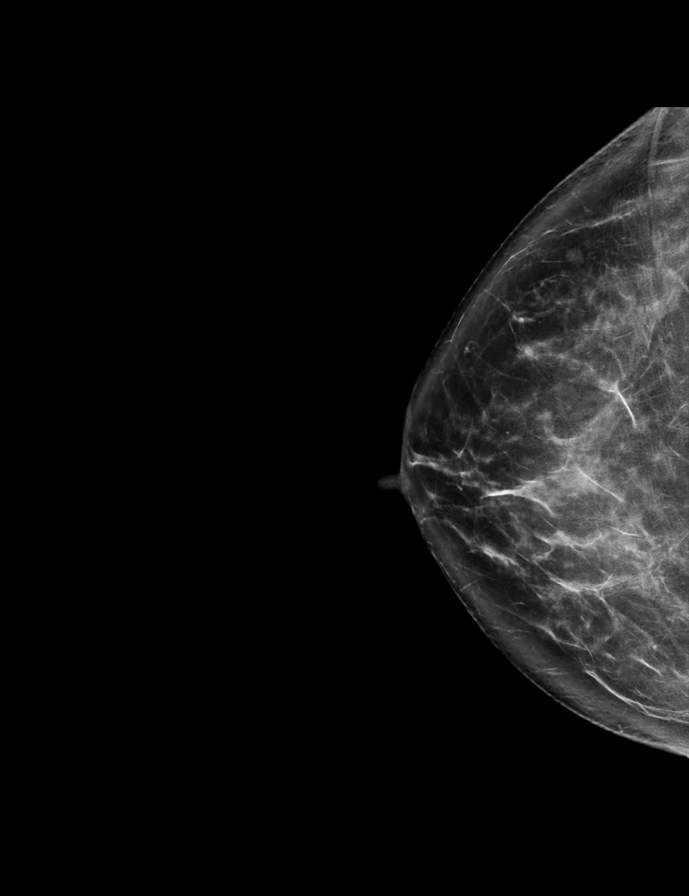

[L CC synth-2D]
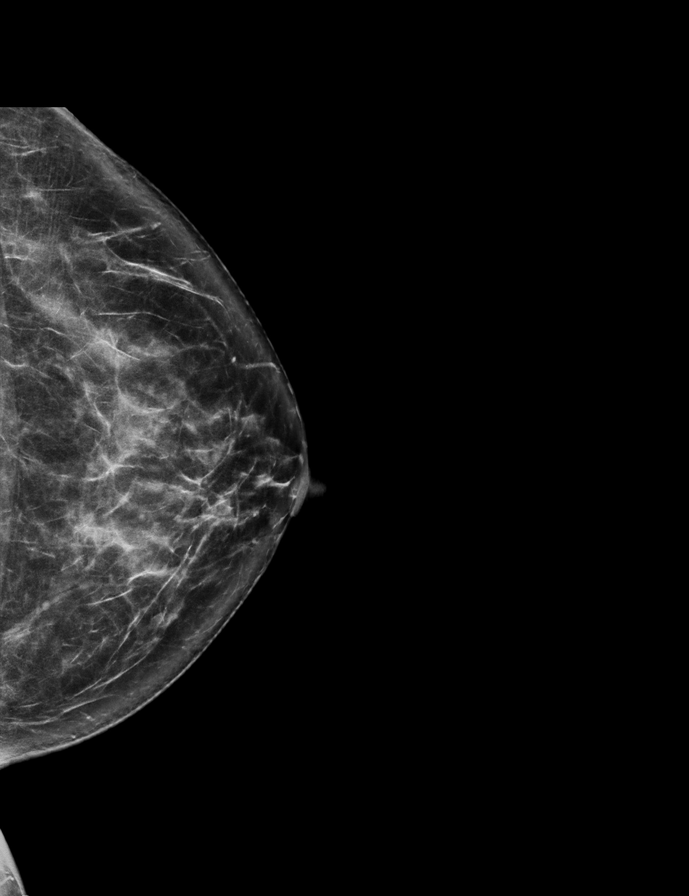

[L MLO synth-2D]
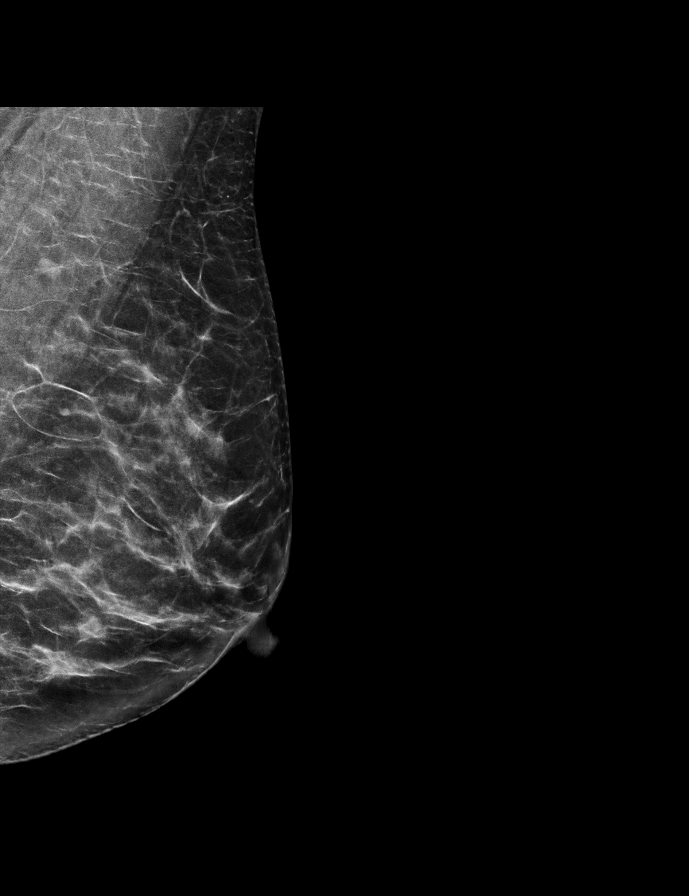

[R MLO synth-2D]
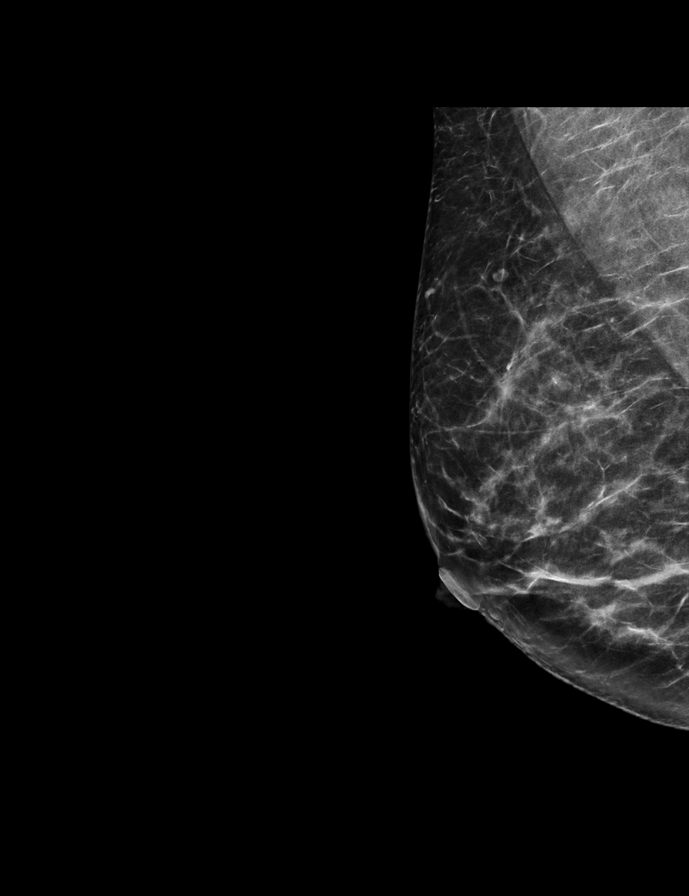

[R MLO tomo · 2 of 59 frames shown]
[frame 20/59]
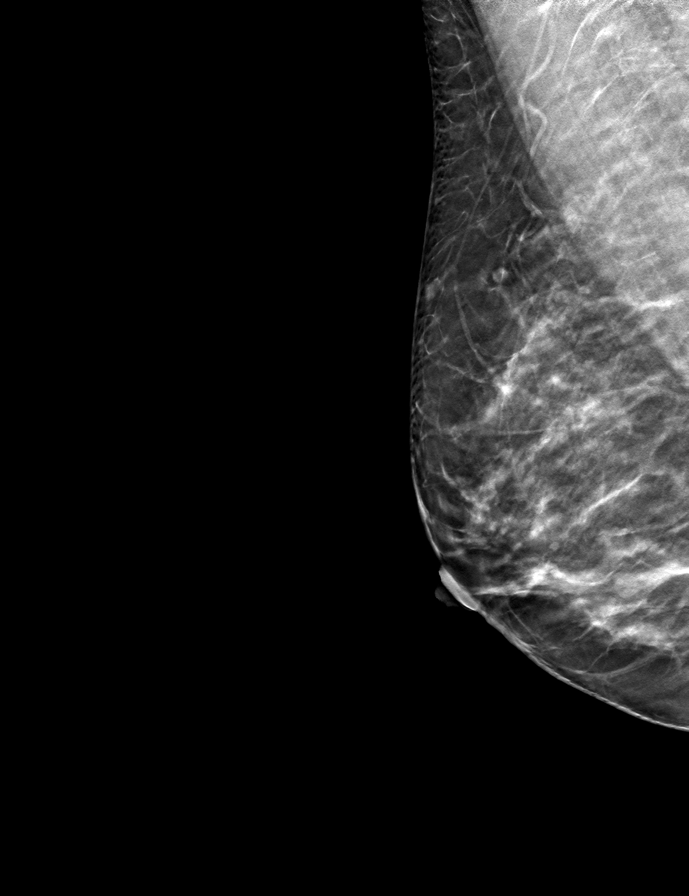
[frame 30/59]
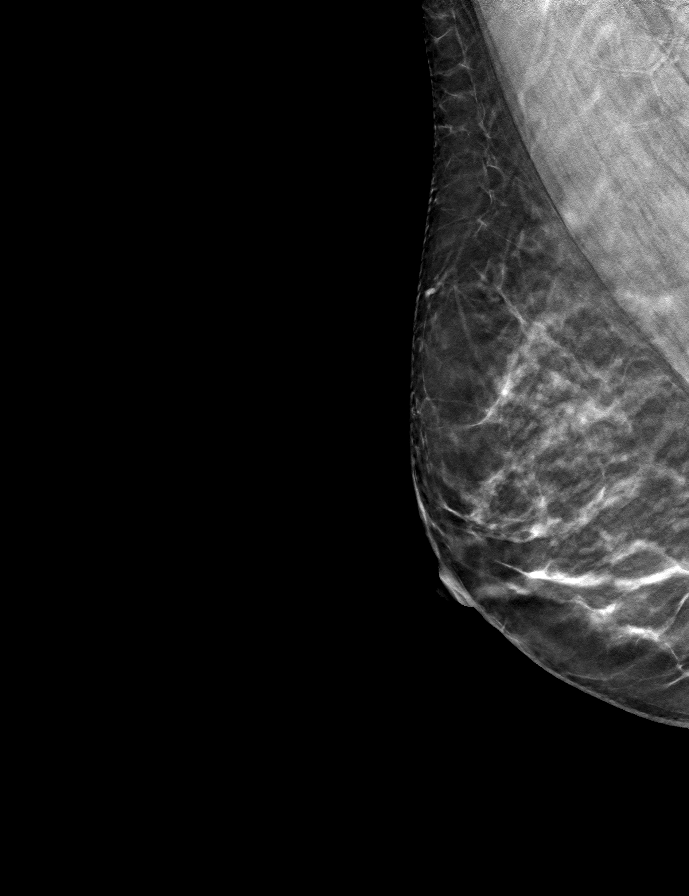

[L MLO tomo · tomo slice 30/59.0]
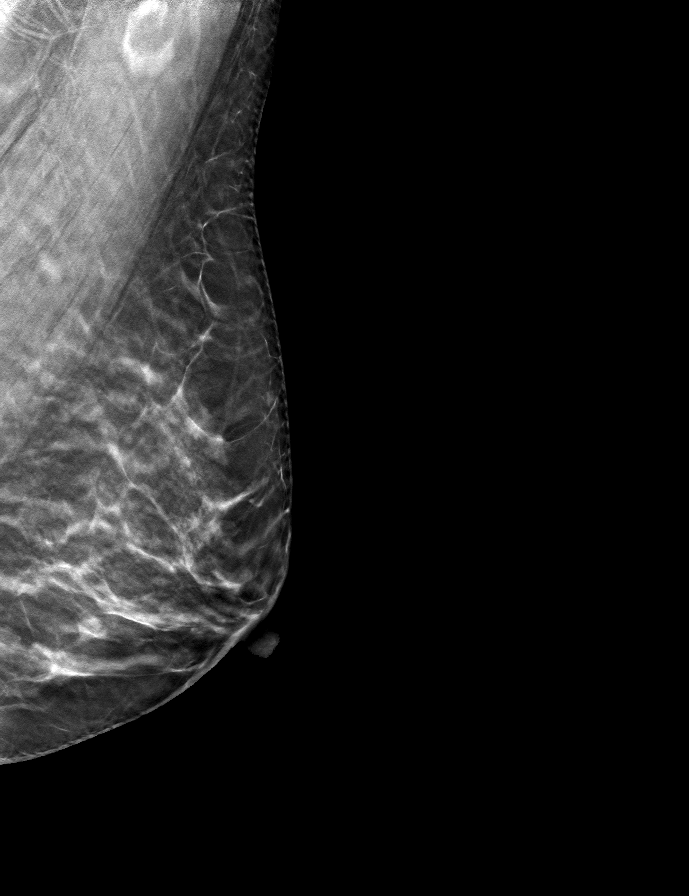

[R CC tomo · tomo slice 36/71.0]
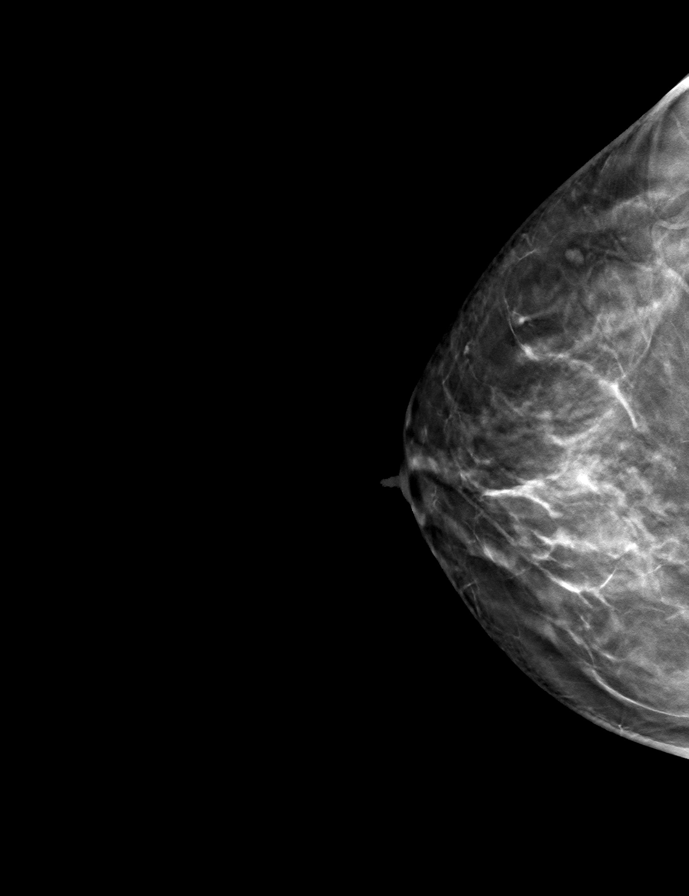

[L CC tomo · tomo slice 33/65.0]
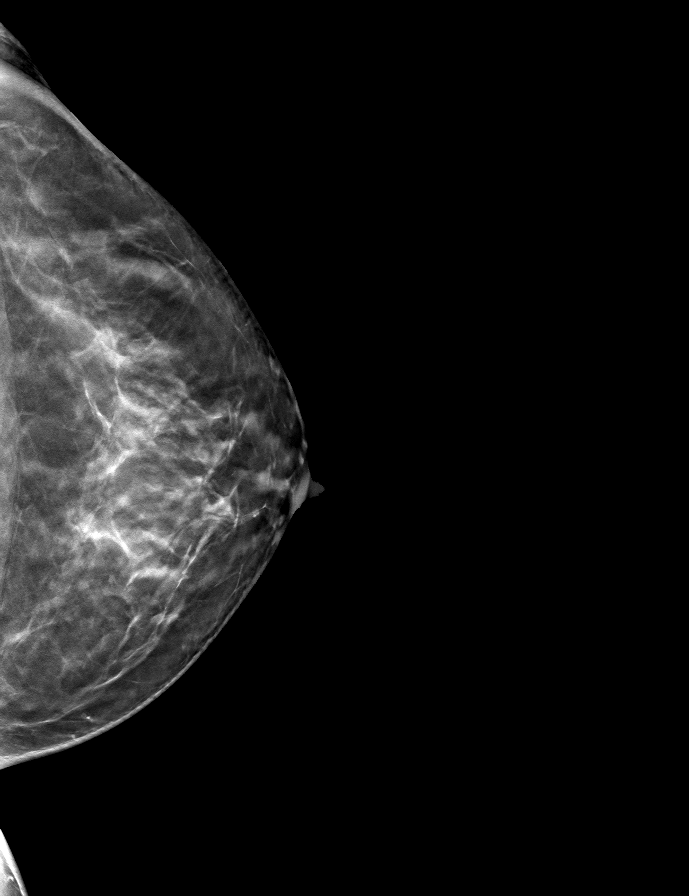

[9 of 24 positions shown; findings below may reference images not displayed]

ACR Breast Density Category c: The breast tissue is heterogeneously
dense, which may obscure small masses.
FINDINGS: There are no findings suspicious for malignancy.
IMPRESSION: No mammographic evidence of malignancy. A result letter of this
screening mammogram will be mailed directly to the patient.

RECOMMENDATION:
Screening mammogram in one year. (Code:[id number]-BC3)

BI-RADS CATEGORY  1: Negative.

## 2022-11-01 ENCOUNTER — Other Ambulatory Visit: Payer: Self-pay | Admitting: Internal Medicine

## 2022-11-01 DIAGNOSIS — Z1231 Encounter for screening mammogram for malignant neoplasm of breast: Secondary | ICD-10-CM

## 2022-11-12 ENCOUNTER — Ambulatory Visit
Admission: RE | Admit: 2022-11-12 | Discharge: 2022-11-12 | Disposition: A | Discharge status: Home / Self Care | Payer: Managed Care, Other (non HMO) | Source: Ambulatory Visit | Attending: Internal Medicine | Admitting: Internal Medicine

## 2022-11-12 DIAGNOSIS — Z1231 Encounter for screening mammogram for malignant neoplasm of breast: Secondary | ICD-10-CM | POA: Insufficient documentation

## 2023-02-06 ENCOUNTER — Ambulatory Visit: Payer: Managed Care, Other (non HMO) | Admitting: Dermatology

## 2023-02-06 VITALS — BP 125/80 | HR 79

## 2023-02-06 DIAGNOSIS — W908XXA Exposure to other nonionizing radiation, initial encounter: Secondary | ICD-10-CM

## 2023-02-06 DIAGNOSIS — L821 Other seborrheic keratosis: Secondary | ICD-10-CM

## 2023-02-06 DIAGNOSIS — L578 Other skin changes due to chronic exposure to nonionizing radiation: Secondary | ICD-10-CM

## 2023-02-06 DIAGNOSIS — X32XXXA Exposure to sunlight, initial encounter: Secondary | ICD-10-CM

## 2023-02-06 DIAGNOSIS — L74519 Primary focal hyperhidrosis, unspecified: Secondary | ICD-10-CM

## 2023-02-06 DIAGNOSIS — Z1283 Encounter for screening for malignant neoplasm of skin: Secondary | ICD-10-CM | POA: Diagnosis not present

## 2023-02-06 DIAGNOSIS — L719 Rosacea, unspecified: Secondary | ICD-10-CM

## 2023-02-06 DIAGNOSIS — L814 Other melanin hyperpigmentation: Secondary | ICD-10-CM

## 2023-02-06 DIAGNOSIS — D1801 Hemangioma of skin and subcutaneous tissue: Secondary | ICD-10-CM | POA: Diagnosis not present

## 2023-02-06 DIAGNOSIS — D229 Melanocytic nevi, unspecified: Secondary | ICD-10-CM

## 2023-02-06 DIAGNOSIS — Z79899 Other long term (current) drug therapy: Secondary | ICD-10-CM

## 2023-02-06 DIAGNOSIS — Z86018 Personal history of other benign neoplasm: Secondary | ICD-10-CM

## 2023-02-06 MED ORDER — QBREXZA 2.4 % EX PADS: MEDICATED_PAD | CUTANEOUS | 11 refills | Status: AC

## 2023-02-06 NOTE — Progress Notes (Signed)
   Follow-Up Visit   Subjective  Amanda Miranda is a 47 y.o. female who presents for the following: Skin Cancer Screening and Full Body Skin Exam  The patient presents for Total-Body Skin Exam (TBSE) for skin cancer screening and mole check. The patient has spots, moles and lesions to be evaluated, some may be new or changing and the patient has concerns that these could be cancer.    The following portions of the chart were reviewed this encounter and updated as appropriate: medications, allergies, medical history  Review of Systems:  No other skin or systemic complaints except as noted in HPI or Assessment and Plan.  Objective  Well appearing patient in no apparent distress; mood and affect are within normal limits.  A full examination was performed including scalp, head, eyes, ears, nose, lips, neck, chest, axillae, abdomen, back, buttocks, bilateral upper extremities, bilateral lower extremities, hands, feet, fingers, toes, fingernails, and toenails. All findings within normal limits unless otherwise noted below.   Relevant physical exam findings are noted in the Assessment and Plan.    Assessment & Plan   LENTIGINES, SEBORRHEIC KERATOSES, HEMANGIOMAS - Benign normal skin lesions - Benign-appearing - Call for any changes  MELANOCYTIC NEVI - Tan-brown and/or pink-flesh-colored symmetric macules and papules - Benign appearing on exam today - Observation - Call clinic for new or changing moles - Recommend daily use of broad spectrum spf 30+ sunscreen to sun-exposed areas.   ACTINIC DAMAGE - Chronic condition, secondary to cumulative UV/sun exposure - diffuse scaly erythematous macules with underlying dyspigmentation - Recommend daily broad spectrum sunscreen SPF 30+ to sun-exposed areas, reapply every 2 hours as needed.  - Staying in the shade or wearing long sleeves, sun glasses (UVA+UVB protection) and wide brim hats (4-inch brim around the entire circumference of the hat)  are also recommended for sun protection.  - Call for new or changing lesions.  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 It is treatable but not curable. It sometimes affects the eyes (ocular rosacea) as well. It may respond to topical and/or systemic medication and can flare with stress, sun exposure, alcohol, exercise and some foods.  Daily application of broad spectrum spf 30+ sunscreen to face is recommended to reduce flares.    Cont Rosacea triple cream apply to face daily    HISTORY OF DYSPLASTIC NEVUS Left lower back paraspinal  No evidence of recurrence today Recommend regular full body skin exams Recommend daily broad spectrum sunscreen SPF 30+ to sun-exposed areas, reapply every 2 hours as needed.  Call if any new or changing lesions are noted between office visits    SKIN CANCER SCREENING PERFORMED TODAY.   Return in about 1 year (around 02/06/2024) for TBSE, hx of .  I, Angelique Holm, CMA, am acting as scribe for Armida Sans, MD .   Documentation: I have reviewed the above documentation for accuracy and completeness, and I agree with the above.  Armida Sans, MD

## 2023-02-06 NOTE — Patient Instructions (Addendum)
Instructions for [facility] Medications FOR ROSACEA One or more of your medications was sent to the [facility] mail order compounding pharmacy. You will receive an email from them and can purchase the medicine through that link. It will then be mailed to your home at the address you confirmed. If for any reason you do not receive an email from them, please check your spam folder. If you still do not find the email, please let us know. [facility] phone number is [phone].   FOR [facility] - [place], [place] - [address] Jacklynn Lewis [place] [address] Phone: [phone]  Fax: [phone]    Due to recent changes in healthcare laws, you may see results of your pathology and/or laboratory studies on MyChart before the doctors have had a chance to review them. We understand that in some cases there may be results that are confusing or concerning to you. Please understand that not all results are received at the same time and often the doctors may need to interpret multiple results in order to provide you with the best plan of care or course of treatment. Therefore, we ask that you please give [place] 2 business days to thoroughly review all your results before contacting the office for clarification. Should we see a critical lab result, you will be contacted sooner.   If You Need Anything After Your Visit  If you have any questions or concerns for your doctor, please call our main line at [phone] and press option 4 to reach your doctor's medical assistant. If no one answers, please leave a voicemail as directed and we will return your call as soon as possible. Messages left after 4 pm will be answered the following business day.   You may also send [place] a message via MyChart. We typically respond to MyChart messages within 1-2 business days.  For prescription refills, please ask your pharmacy to contact our office. Our fax number is  [phone].  If you have an urgent issue when the clinic is closed that cannot wait until the next business day, you can page your doctor at the number below.    Please note that while we do our best to be available for urgent issues outside of office hours, we are not available 24/7.   If you have an urgent issue and are unable to reach [place], you may choose to seek medical care at your doctor's office, retail clinic, urgent care center, or emergency room.  If you have a medical emergency, please immediately call 911 or go to the emergency department.  Pager Numbers  - Dr. Gwen Pounds: [phone]  - Dr. Neale Burly: [phone]  - Dr. Roseanne Reno: [phone]  In the event of inclement weather, please call our main line at [phone] for an update on the status of any delays or closures.  Dermatology Medication Tips: Please keep the boxes that topical medications come in in order to help keep track of the instructions about where and how to use these. Pharmacies typically print the medication instructions only on the boxes and not directly on the medication tubes.   If your medication is too expensive, please contact our office at [phone] option 4 or send [place] a message through MyChart.   We are unable to tell what your co-pay for medications will be in advance as this is different depending on your insurance coverage. However, we may be able to find a substitute medication at lower  cost or fill out paperwork to get insurance to cover a needed medication.   If a prior authorization is required to get your medication covered by your insurance company, please allow [place] 1-2 business days to complete this process.  Drug prices often vary depending on where the prescription is filled and some pharmacies may offer cheaper prices.  The website www.goodrx.com contains coupons for medications through different pharmacies. The prices here do not account for what the cost may be with help from  insurance (it may be cheaper with your insurance), but the website can give you the price if you did not use any insurance.  - You can print the associated coupon and take it with your prescription to the pharmacy.  - You may also stop by our office during regular business hours and pick up a GoodRx coupon card.  - If you need your prescription sent electronically to a different pharmacy, notify our office through [facility] or by phone at [phone] option 4.     Si Usted Necesita Algo Despus de Su Visita  Tambin puede enviarnos un mensaje a travs de Clinical cytogeneticist. Por lo general respondemos a los mensajes de MyChart en el transcurso de 1 a 2 das hbiles.  Para renovar recetas, por favor pida a su farmacia que se ponga en contacto con nuestra oficina. Annie Sable de fax es [place] [phone].  Si tiene un asunto urgente cuando la clnica est cerrada y que no puede esperar hasta el siguiente da hbil, puede llamar/localizar a su doctor(a) al nmero que aparece a continuacin.   Por favor, tenga en cuenta que aunque hacemos todo lo posible para estar disponibles para asuntos urgentes fuera del horario de [place], no estamos disponibles las 24 horas del da, los 7 [address]  [address] de la [place].   Si tiene un problema urgente y no puede comunicarse con nosotros, puede optar por buscar atencin mdica  en el consultorio de su doctor(a), en una clnica privada, en un centro de atencin urgente o en una sala de emergencias.  Si tiene Engineer, drilling, por favor llame inmediatamente al 911 o vaya a la sala de emergencias.  Nmeros de bper  - Dr. Gwen Pounds: [phone]  - Dra. Moye: [phone]  - Dra. Roseanne Reno: [phone]  En caso de inclemencias del [place], por favor llame a Lacy Duverney principal al [phone] para una actualizacin sobre el [place] de cualquier retraso o cierre.  Consejos para la medicacin en dermatologa: Por favor, guarde las cajas en las que vienen los  medicamentos de uso tpico para ayudarle a seguir las instrucciones sobre dnde y cmo usarlos. Las farmacias generalmente imprimen las instrucciones del medicamento slo en las cajas y no directamente en los tubos del [place].   Si su medicamento es muy caro, por favor, pngase en contacto con Rolm Gala llamando al [phone] y presione la opcin 4 o envenos un mensaje a travs de Clinical cytogeneticist.   No podemos decirle cul ser su copago por los medicamentos por adelantado ya que esto es diferente dependiendo de la cobertura de su seguro. Sin embargo, es posible que podamos encontrar un medicamento sustituto a Audiological scientist un formulario para que el seguro cubra el medicamento que se considera necesario.   Si se requiere una autorizacin previa para que su compaa de seguros [place] su medicamento, por favor permtanos de 1 a 2 das hbiles para completar [address]  Los precios de los medicamentos varan con frecuencia dependiendo del Environmental consultant de dnde se surte  la receta y alguna farmacias pueden ofrecer precios ms baratos.  El sitio web www.goodrx.com tiene cupones para medicamentos de Health and safety inspector. Los precios aqu no tienen en cuenta lo que podra costar con la ayuda del seguro (puede ser ms barato con su seguro), pero el sitio web puede darle el precio si no utiliz Tourist information centre manager.  - Puede imprimir el cupn correspondiente y llevarlo con su receta a la farmacia.  - Tambin puede pasar por nuestra oficina durante el horario de atencin regular y Education officer, museum una tarjeta de cupones de GoodRx.  - Si necesita que su receta se enve electrnicamente a una farmacia diferente, informe a nuestra oficina a travs de MyChart de [place] o por telfono llamando al [phone] y presione la opcin 4.

## 2023-02-19 ENCOUNTER — Encounter: Payer: Self-pay | Admitting: Dermatology

## 2023-02-25 ENCOUNTER — Other Ambulatory Visit: Payer: Self-pay | Admitting: Obstetrics & Gynecology

## 2023-02-25 DIAGNOSIS — Z1231 Encounter for screening mammogram for malignant neoplasm of breast: Secondary | ICD-10-CM

## 2023-09-26 ENCOUNTER — Ambulatory Visit
Admission: RE | Admit: 2023-09-26 | Discharge: 2023-09-26 | Disposition: A | Discharge status: Home / Self Care | Payer: Managed Care, Other (non HMO) | Source: Ambulatory Visit | Attending: Family Medicine | Admitting: Family Medicine

## 2023-09-26 VITALS — BP 117/78 | HR 87 | Temp 98.5°F | Resp 16 | Ht 66.0 in | Wt 134.0 lb

## 2023-09-26 DIAGNOSIS — N3001 Acute cystitis with hematuria: Secondary | ICD-10-CM

## 2023-09-26 DIAGNOSIS — H9202 Otalgia, left ear: Secondary | ICD-10-CM | POA: Diagnosis not present

## 2023-09-26 LAB — URINALYSIS, W/ REFLEX TO CULTURE (INFECTION SUSPECTED)
Bilirubin Urine: NEGATIVE
Glucose, UA: NEGATIVE mg/dL
Ketones, ur: NEGATIVE mg/dL
Nitrite: NEGATIVE
Protein, ur: 30 mg/dL — AB
Specific Gravity, Urine: 1.025 (ref 1.005–1.030)
WBC, UA: >50 WBC/hpf (ref 0–5)
pH: 6.5 (ref 5.0–8.0)

## 2023-09-26 MED ORDER — CEFDINIR 300 MG PO CAPS
300.0000 mg | ORAL_CAPSULE | Freq: Two times a day (BID) | ORAL | 0 refills | Status: DC
Start: 2023-09-26 — End: 2023-10-30

## 2023-09-26 NOTE — ED Provider Notes (Signed)
 [facility]    CSN: [id number] Arrival date & time: 09/26/23  1409      History   Chief Complaint Chief Complaint  Patient presents with   Urinary Frequency    I think I have an UTI and an earache. - Entered by patient   Dysuria     HPI HPI Amanda Miranda is a 48 y.o. female.    Damien MARLA Baptist presents for urinary frequency, urinary odor, burning with urination that started 5 days ago.  Tried AZO prior to arrival.  Has  not had any antibiotics in last 30 days.   Denies known STI exposure. She is  not currently pregnant.  No LMP recorded. (Menstrual status: IUD).  No fever, abdominal pain, new back pain or hematuria. No history of kidney stones. Has nausea but also has a headache. Says she has a migraine.   Has left ear pain intermittent for the past 2 weeks but no the pain is constant. Feels like she has swimmers ear again. She got water  in her ear in the shower.         Past Medical History:  Diagnosis Date   Atypical mole 01/31/2021   left lower back paraspinal, mild atypia    IBS (irritable bowel syndrome)    Migraines     Patient Active Problem List   Diagnosis Date Noted   Colon cancer screening    Gastroesophageal reflux disease without esophagitis    Female stress incontinence 07/17/2018   Migraine 06/11/2017   Abnormal cervical Papanicolaou smear 09/16/1992    Past Surgical History:  Procedure Laterality Date   COLONOSCOPY WITH PROPOFOL  N/A 07/06/2020   Procedure: COLONOSCOPY WITH BIOPSY;  Surgeon: Unk Corinn Skiff, MD;  Location[facility];  Service: Endoscopy;  Laterality: N/A;   ESOPHAGOGASTRODUODENOSCOPY (EGD) WITH PROPOFOL  N/A 09/03/2018   Procedure: ESOPHAGOGASTRODUODENOSCOPY (EGD) WITH PROPOFOL ;  Surgeon: Unk Corinn Skiff, MD;  Location: [facility];  Service: Gastroenterology;  Laterality: N/A;   INCONTINENCE SURGERY  04/17/2018   neck surgery     OVARIAN CYST REMOVAL      OB History   No obstetric history on  file.      Home Medications    Prior to Admission medications   Medication Sig Start Date End Date Taking? Authorizing Provider  cefdinir  (OMNICEF ) 300 MG capsule Take 1 capsule (300 mg total) by mouth 2 (two) times daily. 09/26/23  Yes Telesha Deguzman, DO  cyclobenzaprine (FLEXERIL) 5 MG tablet Take by mouth. 03/31/20  Yes [provider]  EPINEPHrine  0.3 mg/0.3 mL IJ SOAJ injection Inject into the skin. 05/25/18  Yes [provider]  Erenumab-aooe (AIMOVIG) 140 MG/ML SOAJ Inject 1 Dose into the skin every 30 (thirty) days.   Yes [provider]  Glycopyrronium Tosylate  (QBREXZA ) 2.4 % PADS Apply to axillary area once a day 02/06/23   Hester Alm BROCKS, MD  levonorgestrel (MIRENA, 52 MG,) 20 MCG/24HR IUD Mirena 20 mcg/24 hours (5 yrs) 52 mg intrauterine device  Take by intrauterine route.   Yes [provider]  omeprazole  (PRILOSEC OTC) 20 MG tablet Take by mouth. 06/11/17   [provider]  ondansetron (ZOFRAN-ODT) 4 MG disintegrating tablet Take by mouth. 03/23/19  Yes [provider]  propranolol (INDERAL) 10 MG tablet Take 10 mg by mouth 2 (two) times daily. 12/28/19   [provider]  Sod Fluoride-Potassium Nitrate (PREVIDENT 5000 SENSITIVE) 1.1-5 % PSTE Prevident 5000 Enamel Protect 1.1 %-5 % dental paste    [provider]  traZODone (DESYREL) 50 MG tablet trazodone 50 mg tablet  TAKE 1 TABLET BY MOUTH NIGHTLY AS NEEDED FOR SLEEP 10/21/19   [provider]    Family History Family History  Problem Relation Age of Onset   Hypertension Mother    COPD Father    Breast cancer Sister     Social History Social History   Tobacco Use   Smoking status: Never   Smokeless tobacco: Never  Vaping Use   Vaping status: Never Used  Substance Use Topics   Alcohol use: Yes    Comment: rarely   Drug use: No     Allergies   Secnidazole, Sumatriptan succinate, and Zolmitriptan   Review of Systems Review of  Systems: :negative unless otherwise stated in HPI.      Physical Exam Triage Vital Signs ED Triage Vitals  Encounter Vitals Group     BP 09/26/23 1437 117/78     Systolic BP Percentile --      Diastolic BP Percentile --      Pulse Rate 09/26/23 1437 87     Resp 09/26/23 1437 16     Temp 09/26/23 1437 98.5 F (36.9 C)     Temp Source 09/26/23 1437 Oral     SpO2 09/26/23 1437 100 %     Weight 09/26/23 1436 134 lb (60.8 kg)     Height 09/26/23 1436 5' 6 (1.676 m)     Head Circumference --      Peak Flow --      Pain Score 09/26/23 1435 8     Pain Loc --      Pain Education --      Exclude from Growth Chart --    No data found.  Updated Vital Signs BP 117/78 (BP Location: Left Arm)   Pulse 87   Temp 98.5 F (36.9 C) (Oral)   Resp 16   Ht 5' 6 (1.676 m)   Wt 60.8 kg   SpO2 100%   BMI 21.63 kg/m   Visual Acuity Right Eye Distance:   Left Eye Distance:   Bilateral Distance:    Right Eye Near:   Left Eye Near:    Bilateral Near:     Physical Exam GEN: well appearing female in no acute distress  HEENT: Bilateral TM though obscured on the left by cerumen is normal.  Right TM is normal, no effusion, erythema or bulging RESP: speaking in full sentences without pause    UC Treatments / Results  Labs (all labs ordered are listed, but only abnormal results are displayed) Labs Reviewed  URINALYSIS, W/ REFLEX TO CULTURE (INFECTION SUSPECTED) - Abnormal; Notable for the following components:      Result Value   APPearance HAZY (*)    Hgb urine dipstick SMALL (*)    Protein, ur 30 (*)    Leukocytes,Ua LARGE (*)    Bacteria, UA FEW (*)    All other components within normal limits    EKG   Radiology No results found.  Procedures Procedures (including critical care time)  Medications Ordered in UC Medications - No data to display  Initial Impression / Assessment and Plan / UC Course  I have reviewed the triage vital signs and the nursing  notes.  Pertinent labs & imaging results that were available during my care of the patient were reviewed by me and considered in my medical decision making (see chart for details).        Patient is  a 48 y.o. female  who presents for 5 days of dysuria and urinary frequency.  Overall patient is well-appearing and afebrile.  Vital signs stable.  UA consistent with acute cystitis.  Hematuria not supported on microscopy.  Treat with Cefdinir   2 times daily. Urine culture obtained.  Follow-up sensitivities and change antibiotics, if needed.  Return precautions including abdominal pain, fever, chills, nausea, or vomiting given. Follow-up,  if symptoms not improving or getting worse. Discussed MDM, treatment plan and plan for follow-up with patient who agrees with plan.   She has no evidence of acute otitis media, otitis externa or cerumen impaction.  Her left TM obscured by cerumen was without fluid or erythema or bulging.     Final Clinical Impressions(s) / UC Diagnoses   Final diagnoses:  Acute cystitis with hematuria  Otalgia of left ear     Discharge Instructions      Stop by the pharmacy to pick up your antibiotics for your urinary tract infection.  Follow up with your primary care provider as needed.      ED Prescriptions     Medication Sig Dispense Auth. Provider   cefdinir  (OMNICEF ) 300 MG capsule Take 1 capsule (300 mg total) by mouth 2 (two) times daily. 14 capsule Johan Creveling, DO      PDMP not reviewed this encounter.   Kriste Berth, DO 09/26/23 1511

## 2023-09-26 NOTE — ED Triage Notes (Addendum)
 Pt c/o dysuria, urinary frequency, and urgency. Started about 5 days ago. Denies fever, new or worsening back pain or abdominal pain. Denies vaginal discharge.  Pt also c/o left ear pain. Started about 2 weeks ago. Denies drainage from the ear.

## 2023-09-26 NOTE — Discharge Instructions (Addendum)
 Stop by the pharmacy to pick up your antibiotics for your urinary tract infection.  Follow up with your primary care provider as needed.

## 2023-09-28 LAB — URINE CULTURE: Culture: >=100000 — AB

## 2023-09-30 ENCOUNTER — Other Ambulatory Visit: Payer: Self-pay | Admitting: Obstetrics & Gynecology

## 2023-09-30 DIAGNOSIS — Z1231 Encounter for screening mammogram for malignant neoplasm of breast: Secondary | ICD-10-CM

## 2023-10-30 ENCOUNTER — Ambulatory Visit
Admission: RE | Admit: 2023-10-30 | Discharge: 2023-10-30 | Disposition: A | Discharge status: Home / Self Care | Payer: Managed Care, Other (non HMO) | Source: Ambulatory Visit | Attending: Emergency Medicine | Admitting: Emergency Medicine

## 2023-10-30 VITALS — BP 116/83 | HR 77 | Temp 98.5°F | Resp 16

## 2023-10-30 DIAGNOSIS — N390 Urinary tract infection, site not specified: Secondary | ICD-10-CM

## 2023-10-30 LAB — URINALYSIS, W/ REFLEX TO CULTURE (INFECTION SUSPECTED)
Bilirubin Urine: NEGATIVE
Glucose, UA: NEGATIVE mg/dL
Hgb urine dipstick: NEGATIVE
Ketones, ur: NEGATIVE mg/dL
Nitrite: NEGATIVE
Protein, ur: NEGATIVE mg/dL
RBC / HPF: NONE SEEN RBC/hpf (ref 0–5)
Specific Gravity, Urine: 1.015 (ref 1.005–1.030)
pH: 7.5 (ref 5.0–8.0)

## 2023-10-30 MED ORDER — NITROFURANTOIN MONOHYD MACRO 100 MG PO CAPS: 100.0000 mg | ORAL_CAPSULE | Freq: Two times a day (BID) | ORAL | 0 refills | Status: DC

## 2023-10-30 MED ORDER — PHENAZOPYRIDINE HCL 200 MG PO TABS: 200.0000 mg | ORAL_TABLET | Freq: Three times a day (TID) | ORAL | 0 refills | Status: DC

## 2023-10-30 NOTE — ED Provider Notes (Signed)
[facility]    CSN: [id number] Arrival date & time: 10/30/23  4098      History   Chief Complaint Chief Complaint  Patient presents with   Urinary Frequency    UTI infection - Entered by patient    HPI Amanda Miranda is a 48 y.o. female.   HPI  48 year old female with a past medical history significant for migraine headaches and IBS presents for evaluation of urinary symptoms that began 3 days ago that include dysuria, urgency, frequency, and low back pain.  She also reports that her urine has been cloudy and has had a different odor.  She reports that the change in odor is her normal [place] that she has a UTI.  She denies any fever, nausea or vomiting, blood in her urine, vaginal discharge or itching.  Past Medical History:  Diagnosis Date   Atypical mole 01/31/2021   left lower back paraspinal, mild atypia    IBS (irritable bowel syndrome)    Migraines     Patient Active Problem List   Diagnosis Date Noted   Colon cancer screening    Gastroesophageal reflux disease without esophagitis    Female stress incontinence 07/17/2018   Migraine 06/11/2017   Abnormal cervical Papanicolaou smear 09/16/1992    Past Surgical History:  Procedure Laterality Date   COLONOSCOPY WITH PROPOFOL N/A 07/06/2020   Procedure: COLONOSCOPY WITH BIOPSY;  Surgeon: Toney Reil, MD;  Location: [facility];  Service: Endoscopy;  Laterality: N/A;   ESOPHAGOGASTRODUODENOSCOPY (EGD) WITH PROPOFOL N/A 09/03/2018   Procedure: ESOPHAGOGASTRODUODENOSCOPY (EGD) WITH PROPOFOL;  Surgeon: Toney Reil, MD;  Location: [facility];  Service: Gastroenterology;  Laterality: N/A;   INCONTINENCE SURGERY  04/17/2018   neck surgery     OVARIAN CYST REMOVAL      OB History   No obstetric history on file.      Home Medications    Prior to Admission medications   Medication Sig Start Date End Date Taking? Authorizing Provider  nitrofurantoin, macrocrystal-monohydrate,  (MACROBID) 100 MG capsule Take 1 capsule (100 mg total) by mouth 2 (two) times daily. 10/30/23  Yes Becky Augusta, NP  phenazopyridine (PYRIDIUM) 200 MG tablet Take 1 tablet (200 mg total) by mouth 3 (three) times daily. 10/30/23  Yes Becky Augusta, NP  cyclobenzaprine (FLEXERIL) 5 MG tablet Take by mouth. 03/31/20   [provider]  EPINEPHrine 0.3 mg/0.3 mL IJ SOAJ injection Inject into the skin. 05/25/18   [provider]  Erenumab-aooe (AIMOVIG) 140 MG/ML SOAJ Inject 1 Dose into the skin every 30 (thirty) days.    [provider]  Glycopyrronium Tosylate (QBREXZA) 2.4 % PADS Apply to axillary area once a day 02/06/23   Deirdre Evener, MD  levonorgestrel (MIRENA, 52 MG,) 20 MCG/24HR IUD Mirena 20 mcg/24 hours (5 yrs) 52 mg intrauterine device  Take by intrauterine route.    [provider]  omeprazole (PRILOSEC OTC) 20 MG tablet Take by mouth. 06/11/17   [provider]  ondansetron (ZOFRAN-ODT) 4 MG disintegrating tablet Take by mouth. 03/23/19   [provider]  propranolol (INDERAL) 10 MG tablet Take 10 mg by mouth 2 (two) times daily. 12/28/19   [provider]  Sod Fluoride-Potassium Nitrate (PREVIDENT 5000 SENSITIVE) 1.1-5 % PSTE Prevident 5000 Enamel Protect 1.1 %-5 % dental paste    [provider]  traZODone (DESYREL) 50 MG tablet trazodone 50 mg tablet  TAKE 1 TABLET BY MOUTH NIGHTLY AS NEEDED FOR SLEEP 10/21/19  [provider]    Family History Family History  Problem Relation Age of Onset   Hypertension Mother    COPD Father    Breast cancer Sister     Social History Social History   Tobacco Use   Smoking status: Never   Smokeless tobacco: Never  Vaping Use   Vaping status: Never Used  Substance Use Topics   Alcohol use: Yes    Comment: rarely   Drug use: No     Allergies   Secnidazole, Sumatriptan succinate, and Zolmitriptan   Review of Systems Review of Systems  Constitutional:   Negative for fever.  Gastrointestinal:  Negative for abdominal pain, nausea and vomiting.  Genitourinary:  Positive for dysuria, frequency and urgency. Negative for hematuria, vaginal discharge and vaginal pain.  Musculoskeletal:  Positive for back pain.     Physical Exam Triage Vital Signs ED Triage Vitals  Encounter Vitals Group     BP      Systolic BP Percentile      Diastolic BP Percentile      Pulse      Resp      Temp      Temp src      SpO2      Weight      Height      Head Circumference      Peak Flow      Pain Score      Pain Loc      Pain Education      Exclude from Growth Chart    No data found.  Updated Vital Signs BP 116/83 (BP Location: Left Arm)   Pulse 77   Temp 98.5 F (36.9 C)   Resp 16   Visual Acuity Right Eye Distance:   Left Eye Distance:   Bilateral Distance:    Right Eye Near:   Left Eye Near:    Bilateral Near:     Physical Exam Vitals and nursing note reviewed.  Constitutional:      Appearance: Normal appearance. She is not ill-appearing.  HENT:     Head: Normocephalic and atraumatic.  Cardiovascular:     Rate and Rhythm: Normal rate and regular rhythm.     Pulses: Normal pulses.     Heart sounds: Normal heart sounds. No murmur heard.    No friction rub. No gallop.  Pulmonary:     Effort: Pulmonary effort is normal.     Breath sounds: Normal breath sounds. No wheezing, rhonchi or rales.  Abdominal:     General: Abdomen is flat.     Palpations: Abdomen is soft.     Tenderness: There is no abdominal tenderness. There is no right CVA tenderness, left CVA tenderness, guarding or rebound.  Skin:    General: Skin is warm and dry.     Capillary Refill: Capillary refill takes less than 2 seconds.     Findings: No rash.  Neurological:     General: No focal deficit present.     Mental Status: She is alert and oriented to person, place, and time.      UC Treatments / Results  Labs (all labs ordered are listed, but only  abnormal results are displayed) Labs Reviewed  URINALYSIS, W/ REFLEX TO CULTURE (INFECTION SUSPECTED) - Abnormal; Notable for the following components:      Result Value   Leukocytes,Ua SMALL (*)    Bacteria, UA MANY (*)    All other components within normal limits  URINE CULTURE  EKG   Radiology No results found.  Procedures Procedures (including critical care time)  Medications Ordered in UC Medications - No data to display  Initial Impression / Assessment and Plan / UC Course  I have reviewed the triage vital signs and the nursing notes.  Pertinent labs & imaging results that were available during my care of the patient were reviewed by me and considered in my medical decision making (see chart for details).   Patient is a pleasant, nontoxic-appearing 48 year old female presenting for evaluation of UTI symptoms outlined HPI above.  On exam her abdomen is soft, flat, and nontender.  She has no CVA tenderness on exam.  She denies any vaginal discharge or itching.  I will order urinalysis to evaluate for the presence of UTI.  Urinalysis is positive for small cassette esterase but negative for nitrates, protein, ketones, or blood.  Reflex microscopy shows 21-50 WBCs with many bacteria and WBC clumps.  Urine will reflex to culture.  I will discharge patient with a diagnosis of urinary tract infection.  I will start her on Macrobid 100 mg twice daily for 5 days for treatment of her UTI and prescribe Pyridium to help with the urinary discomfort that she can take every 8 hours.  Return precautions reviewed.   Final Clinical Impressions(s) / UC Diagnoses   Final diagnoses:  Lower urinary tract infectious disease     Discharge Instructions      Take the Macrobid twice daily for 5 days with food for treatment of urinary tract infection.  Use the Pyridium every 8 hours as needed for urinary discomfort.  This will turn your urine a bright red-orange.  Increase your oral fluid  intake so that you increase your urine production and or flushing your urinary system.  Take an over-the-counter probiotic, such as Culturelle-Align-Activia, 1 hour after each dose of antibiotic to prevent diarrhea or yeast infections from forming.  We will culture urine and change the antibiotics if necessary.  Return for reevaluation, or see your primary care provider, for any new or worsening symptoms.      ED Prescriptions     Medication Sig Dispense Auth. Provider   nitrofurantoin, macrocrystal-monohydrate, (MACROBID) 100 MG capsule Take 1 capsule (100 mg total) by mouth 2 (two) times daily. 10 capsule Becky Augusta, NP   phenazopyridine (PYRIDIUM) 200 MG tablet Take 1 tablet (200 mg total) by mouth 3 (three) times daily. 6 tablet Becky Augusta, NP      PDMP not reviewed this encounter.   Becky Augusta, NP 10/30/23 1037

## 2023-10-30 NOTE — Discharge Instructions (Addendum)

## 2023-10-30 NOTE — ED Triage Notes (Signed)
Pt c/o back pain, urinary frequency, dysuria and urgency x 3 days.

## 2023-11-01 LAB — URINE CULTURE: Culture: >=100000 — AB

## 2023-12-23 ENCOUNTER — Ambulatory Visit

## 2023-12-25 ENCOUNTER — Ambulatory Visit
Admission: RE | Admit: 2023-12-25 | Discharge: 2023-12-25 | Disposition: A | Discharge status: Home / Self Care | Source: Ambulatory Visit | Attending: Family Medicine | Admitting: Family Medicine

## 2023-12-25 VITALS — BP 119/79 | HR 81 | Temp 98.6°F | Resp 16

## 2023-12-25 DIAGNOSIS — N3001 Acute cystitis with hematuria: Secondary | ICD-10-CM | POA: Diagnosis not present

## 2023-12-25 LAB — URINALYSIS, W/ REFLEX TO CULTURE (INFECTION SUSPECTED)
Bilirubin Urine: NEGATIVE
Glucose, UA: NEGATIVE mg/dL
Nitrite: POSITIVE — AB
Protein, ur: >300 mg/dL — AB
Specific Gravity, Urine: >1.03 — ABNORMAL HIGH (ref 1.005–1.030)
WBC, UA: >50 WBC/hpf (ref 0–5)
pH: 5.5 (ref 5.0–8.0)

## 2023-12-25 MED ORDER — NITROFURANTOIN MONOHYD MACRO 100 MG PO CAPS: 100.0000 mg | ORAL_CAPSULE | Freq: Two times a day (BID) | ORAL | 0 refills | Status: DC

## 2023-12-25 MED ORDER — FLUCONAZOLE 150 MG PO TABS: 150.0000 mg | ORAL_TABLET | Freq: Once | ORAL | 0 refills | Status: AC

## 2023-12-25 NOTE — Discharge Instructions (Addendum)
 Stop by the pharmacy to pick up your prescriptions.  Follow up with your primary care provider or return to the urgent care, if not improving.   I sent your urine for culture.  Someone may call you to change your antibiotics.

## 2023-12-25 NOTE — ED Provider Notes (Signed)
 [facility]    CSN: [id number] Arrival date & time: 12/25/23  1515      History   Chief Complaint Chief Complaint  Patient presents with   Urinary Frequency    I have a UTI infection. - Entered by patient   Dysuria     HPI HPI Amanda Miranda is a 48 y.o. female.    Amanda Miranda presents for dysuria, urinary cloudiness with odor, back pain and urinary frequency that started 5-6 days ago.  Tried motrin and AZO prior to arrival.  Has  not had any antibiotics in last 30 days.   Denies known STI exposure.  She is  not currently pregnant.  No LMP recorded. (Menstrual status: IUD).    - Abnormal vaginal discharge: no - vaginal odor: no - vaginal bleeding: no - Dysuria: yes  - Hematuria: no  - Urinary urgency: yes  - Urinary frequency: yes   - Fever: no  - Abdominal pain: no  - Pelvic pain: no - Rash/Skin lesions/mouth ulcers: no - Nausea: no  - Vomiting: no  - Back Pain: right lower        Past Medical History:  Diagnosis Date   Atypical mole 01/31/2021   left lower back paraspinal, mild atypia    IBS (irritable bowel syndrome)    Migraines     Patient Active Problem List   Diagnosis Date Noted   Colon cancer screening    Gastroesophageal reflux disease without esophagitis    Female stress incontinence 07/17/2018   Migraine 06/11/2017   Abnormal cervical Papanicolaou smear 09/16/1992    Past Surgical History:  Procedure Laterality Date   COLONOSCOPY WITH PROPOFOL N/A 07/06/2020   Procedure: COLONOSCOPY WITH BIOPSY;  Surgeon: Selena Daily, MD;  Location: [facility];  Service: Endoscopy;  Laterality: N/A;   ESOPHAGOGASTRODUODENOSCOPY (EGD) WITH PROPOFOL N/A 09/03/2018   Procedure: ESOPHAGOGASTRODUODENOSCOPY (EGD) WITH PROPOFOL;  Surgeon: Selena Daily, MD;  Location: [place] [facility];  Service: Gastroenterology;  Laterality: N/A;   INCONTINENCE SURGERY  04/17/2018   neck surgery     OVARIAN CYST REMOVAL      OB History    No obstetric history on file.      Home Medications    Prior to Admission medications   Medication Sig Start Date End Date Taking? Authorizing Provider  nitrofurantoin, macrocrystal-monohydrate, (MACROBID) 100 MG capsule Take 1 capsule (100 mg total) by mouth 2 (two) times daily. 12/25/23  Yes Kalanie Fewell, DO  cyclobenzaprine (FLEXERIL) 5 MG tablet Take by mouth. 03/31/20   [provider]  EPINEPHrine 0.3 mg/0.3 mL IJ SOAJ injection Inject into the skin. 05/25/18   [provider]  Erenumab-aooe (AIMOVIG) 140 MG/ML SOAJ Inject 1 Dose into the skin every 30 (thirty) days.    [provider]  Glycopyrronium Tosylate (QBREXZA) 2.4 % PADS Apply to axillary area once a day 02/06/23   Elta Halter, MD  levonorgestrel (MIRENA, 52 MG,) 20 MCG/24HR IUD Mirena 20 mcg/24 hours (5 yrs) 52 mg intrauterine device  Take by intrauterine route.    [provider]  omeprazole (PRILOSEC OTC) 20 MG tablet Take by mouth. 06/11/17   [provider]  ondansetron (ZOFRAN-ODT) 4 MG disintegrating tablet Take by mouth. 03/23/19   [provider]  phenazopyridine (PYRIDIUM) 200 MG tablet Take 1 tablet (200 mg total) by mouth 3 (three) times daily. 10/30/23   Kent Pear, NP  propranolol (INDERAL) 10 MG tablet Take 10 mg by mouth 2 (two)  times daily. 12/28/19   [provider]  Sod Fluoride-Potassium Nitrate (PREVIDENT 5000 SENSITIVE) 1.1-5 % PSTE Prevident 5000 Enamel Protect 1.1 %-5 % dental paste    [provider]  traZODone (DESYREL) 50 MG tablet trazodone 50 mg tablet  TAKE 1 TABLET BY MOUTH NIGHTLY AS NEEDED FOR SLEEP 10/21/19   [provider]    Family History Family History  Problem Relation Age of Onset   Hypertension Mother    COPD Father    Breast cancer Sister     Social History Social History   Tobacco Use   Smoking status: Never   Smokeless tobacco: Never  Vaping Use   Vaping status: Never Used  Substance  Use Topics   Alcohol use: Yes    Comment: rarely   Drug use: No     Allergies   Secnidazole, Sumatriptan succinate, and Zolmitriptan   Review of Systems Review of Systems: :negative unless otherwise stated in HPI.      Physical Exam Triage Vital Signs ED Triage Vitals  Encounter Vitals Group     BP 12/25/23 1556 119/79     Systolic BP Percentile --      Diastolic BP Percentile --      Pulse Rate 12/25/23 1556 81     Resp 12/25/23 1556 16     Temp 12/25/23 1556 98.6 F (37 C)     Temp Source 12/25/23 1556 Oral     SpO2 12/25/23 1556 98 %     Weight --      Height --      Head Circumference --      Peak Flow --      Pain Score 12/25/23 1555 4     Pain Loc --      Pain Education --      Exclude from Growth Chart --    No data found.  Updated Vital Signs BP 119/79 (BP Location: Right Arm)   Pulse 81   Temp 98.6 F (37 C) (Oral)   Resp 16   SpO2 98%   Visual Acuity Right Eye Distance:   Left Eye Distance:   Bilateral Distance:    Right Eye Near:   Left Eye Near:    Bilateral Near:     Physical Exam GEN: well appearing female in no acute distress  CVS: well perfused  RESP: speaking in full sentences without pause  ABD: soft, lower abdominal tenderness , non-distended, no palpable masses, no Cva tenderness    UC Treatments / Results  Labs (all labs ordered are listed, but only abnormal results are displayed) Labs Reviewed  URINE CULTURE - Abnormal; Notable for the following components:      Result Value   Culture >=100,000 COLONIES/mL ESCHERICHIA COLI (*)    Organism ID, Bacteria ESCHERICHIA COLI (*)    All other components within normal limits  URINALYSIS, W/ REFLEX TO CULTURE (INFECTION SUSPECTED) - Abnormal; Notable for the following components:   APPearance CLOUDY (*)    Specific Gravity, Urine >1.030 (*)    Hgb urine dipstick MODERATE (*)    Ketones, ur TRACE (*)    Protein, ur >300 (*)    Nitrite POSITIVE (*)    Leukocytes,Ua MODERATE (*)     Bacteria, UA MANY (*)    All other components within normal limits    EKG   Radiology No results found.  Procedures Procedures (including critical care time)  Medications Ordered in UC Medications - No data to display  Initial  Impression / Assessment and Plan / UC Course  I have reviewed the triage vital signs and the nursing notes.  Pertinent labs & imaging results that were available during my care of the patient were reviewed by me and considered in my medical decision making (see chart for details).       Patient is a 47 y.o. female  who presents for 5-6 days of dysuria and urine cloudines.  Overall patient is well-appearing and afebrile.  Vital signs stable.  UA consistent with acute cystitis.  Hematuria supported on microscopy.   Treat with Macrobid 2 times daily for 5 days.  Urine culture obtained.  Follow-up sensitivities and change antibiotics, if needed.   - Return precautions including abdominal pain, fever, chills, nausea, or vomiting given. Follow-up,  if symptoms not improving or getting worse. Discussed MDM, treatment plan and plan for follow-up with patient who agrees with plan.       Final Clinical Impressions(s) / UC Diagnoses   Final diagnoses:  Acute cystitis with hematuria     Discharge Instructions      Stop by the pharmacy to pick up your prescriptions.  Follow up with your primary care provider or return to the urgent care, if not improving.   I sent your urine for culture.  Someone may call you to change your antibiotics.      ED Prescriptions     Medication Sig Dispense Auth. Provider   fluconazole (DIFLUCAN) 150 MG tablet Take 1 tablet (150 mg total) by mouth once for 1 dose. 1 tablet Jamai Dolce, DO   nitrofurantoin, macrocrystal-monohydrate, (MACROBID) 100 MG capsule Take 1 capsule (100 mg total) by mouth 2 (two) times daily. 10 capsule Odessie Polzin, DO      PDMP not reviewed this encounter.   Kai Railsback,  DO 01/01/24 [phone]

## 2023-12-25 NOTE — ED Triage Notes (Signed)
 Pt c/o dysuria, back pain, urine odor, and urinary frequency since the weekend per patient.

## 2023-12-27 LAB — URINE CULTURE: Culture: >=100000 — AB

## 2024-01-01 ENCOUNTER — Ambulatory Visit
Admission: RE | Admit: 2024-01-01 | Discharge: 2024-01-01 | Disposition: A | Discharge status: Home / Self Care | Source: Ambulatory Visit | Attending: Obstetrics & Gynecology | Admitting: Obstetrics & Gynecology

## 2024-01-01 DIAGNOSIS — Z1231 Encounter for screening mammogram for malignant neoplasm of breast: Secondary | ICD-10-CM | POA: Insufficient documentation

## 2024-02-12 ENCOUNTER — Ambulatory Visit: Payer: Managed Care, Other (non HMO) | Admitting: Dermatology

## 2024-02-25 ENCOUNTER — Ambulatory Visit
Admission: RE | Admit: 2024-02-25 | Discharge: 2024-02-25 | Disposition: A | Discharge status: Home / Self Care | Source: Ambulatory Visit | Attending: Family Medicine | Admitting: Family Medicine

## 2024-02-25 VITALS — BP 114/78 | HR 75 | Temp 98.6°F | Resp 16

## 2024-02-25 DIAGNOSIS — L750 Bromhidrosis: Secondary | ICD-10-CM | POA: Insufficient documentation

## 2024-02-25 DIAGNOSIS — B379 Candidiasis, unspecified: Secondary | ICD-10-CM | POA: Diagnosis present

## 2024-02-25 DIAGNOSIS — R300 Dysuria: Secondary | ICD-10-CM | POA: Diagnosis not present

## 2024-02-25 LAB — URINALYSIS, W/ REFLEX TO CULTURE (INFECTION SUSPECTED)
Bilirubin Urine: NEGATIVE
Glucose, UA: NEGATIVE mg/dL
Ketones, ur: NEGATIVE mg/dL
Nitrite: NEGATIVE
Protein, ur: NEGATIVE mg/dL
Specific Gravity, Urine: 1.025 (ref 1.005–1.030)
pH: 5 (ref 5.0–8.0)

## 2024-02-25 MED ORDER — FLUCONAZOLE 150 MG PO TABS: 150.0000 mg | ORAL_TABLET | ORAL | 0 refills | Status: AC

## 2024-02-25 MED ORDER — NITROFURANTOIN MONOHYD MACRO 100 MG PO CAPS: 100.0000 mg | ORAL_CAPSULE | Freq: Two times a day (BID) | ORAL | 0 refills | Status: DC

## 2024-02-25 NOTE — Discharge Instructions (Addendum)
 Stop by the pharmacy to pick up your prescriptions.  Follow up with your primary care provider or return to the urgent care, if not improving.   I sent your urine for culture.  Someone may call you to stop or change antibiotics based off your culture results.   Your vaginal test results will be available in the next 72 hours. If positive, someone will contact you.  You should see your results in your MyChart account.

## 2024-02-25 NOTE — ED Provider Notes (Addendum)
 [facility]    CSN: [id number] Arrival date & time: 02/25/24  1555      History   Chief Complaint Chief Complaint  Patient presents with   Urinary Frequency    I believe I have a UTI. - Entered by patient     HPI HPI Amanda Miranda is a 48 y.o. female.    Amanda Miranda presents for dysuria, back pain, odor and urinary frquency that started on a month. She was in [place]  and took AZO which helped but her symptoms returned again.  Has  not had any antibiotics in last 30 days.   Denies known STI exposure.  Amanda Miranda does not use condoms regularly. She is  not currently pregnant.  No LMP recorded (lmp unknown). (Menstrual status: IUD).    - Abnormal vaginal discharge: yes  - vaginal / urinary odor: yes  - vaginal bleeding: no - Dysuria: yes  - Hematuria: no - Urinary urgency: yes  - Urinary frequency: yes   - Fever: no - Abdominal pain: no  - Pelvic pain: no - Rash/Skin lesions/mouth ulcers: no - Nausea: no  - Vomiting: no  - Back Pain: yes        Past Medical History:  Diagnosis Date   Atypical mole 01/31/2021   left lower back paraspinal, mild atypia    IBS (irritable bowel syndrome)    Migraines     Patient Active Problem List   Diagnosis Date Noted   Colon cancer screening    Gastroesophageal reflux disease without esophagitis    Female stress incontinence 07/17/2018   Migraine 06/11/2017   Abnormal cervical Papanicolaou smear 09/16/1992    Past Surgical History:  Procedure Laterality Date   COLONOSCOPY WITH PROPOFOL  N/A 07/06/2020   Procedure: COLONOSCOPY WITH BIOPSY;  Surgeon: Amanda Daily, MD;  Location: [facility];  Service: Endoscopy;  Laterality: N/A;   ESOPHAGOGASTRODUODENOSCOPY (EGD) WITH PROPOFOL  N/A 09/03/2018   Procedure: ESOPHAGOGASTRODUODENOSCOPY (EGD) WITH PROPOFOL ;  Surgeon: Amanda Daily, MD;  Location: [facility];  Service: Gastroenterology;  Laterality: N/A;   INCONTINENCE SURGERY  04/17/2018    neck surgery     OVARIAN CYST REMOVAL      OB History   No obstetric history on file.      Home Medications    Prior to Admission medications   Medication Sig Start Date End Date Taking? Authorizing Provider  fluconazole  (DIFLUCAN ) 150 MG tablet Take 1 tablet (150 mg total) by mouth once a week for 2 doses. 02/25/24 03/04/24 Yes Amanda Schubring, DO  nitrofurantoin , macrocrystal-monohydrate, (MACROBID ) 100 MG capsule Take 1 capsule (100 mg total) by mouth 2 (two) times Miranda. 02/25/24  Yes Amanda Coller, DO  cyclobenzaprine (FLEXERIL) 5 MG tablet Take by mouth. 03/31/20   [provider]  EPINEPHrine  0.3 mg/0.3 mL IJ SOAJ injection Inject into the skin. 05/25/18   [provider]  Erenumab-aooe (AIMOVIG) 140 MG/ML SOAJ Inject 1 Dose into the skin every 30 (thirty) days.    [provider]  Glycopyrronium Tosylate  (QBREXZA ) 2.4 % PADS Apply to axillary area once a day 02/06/23   Amanda Halter, MD  levonorgestrel (MIRENA, 52 MG,) 20 MCG/24HR IUD Mirena 20 mcg/24 hours (5 yrs) 52 mg intrauterine device  Take by intrauterine route.    [provider]  omeprazole  (PRILOSEC OTC) 20 MG tablet Take by mouth. 06/11/17   [provider]  ondansetron (ZOFRAN-ODT) 4 MG disintegrating tablet Take by mouth. 03/23/19   [provider]  phenazopyridine  (PYRIDIUM ) 200 MG tablet Take 1 tablet (200 mg total) by mouth 3 (three) times Miranda. 10/30/23   Amanda Pear, NP  propranolol (INDERAL) 10 MG tablet Take 10 mg by mouth 2 (two) times Miranda. 12/28/19   [provider]  Sod Fluoride-Potassium Nitrate (PREVIDENT 5000 SENSITIVE) 1.1-5 % PSTE Prevident 5000 Enamel Protect 1.1 %-5 % dental paste    [provider]  traZODone (DESYREL) 50 MG tablet trazodone 50 mg tablet  TAKE 1 TABLET BY MOUTH NIGHTLY AS NEEDED FOR SLEEP 10/21/19   [provider]    Family History Family History  Problem Relation Age of Onset   Hypertension Mother     COPD Father    Breast cancer Sister     Social History Social History   Tobacco Use   Smoking status: Never   Smokeless tobacco: Never  Vaping Use   Vaping status: Never Used  Substance Use Topics   Alcohol use: Yes    Comment: rarely   Drug use: No     Allergies   Secnidazole, Sumatriptan succinate, and Zolmitriptan   Review of Systems Review of Systems: :negative unless otherwise stated in HPI.      Physical Exam Triage Vital Signs ED Triage Vitals [02/25/24 1623]  Encounter Vitals Group     BP 114/78     Systolic BP Percentile      Diastolic BP Percentile      Pulse Rate 75     Resp 16     Temp 98.6 F (37 C)     Temp Source Oral     SpO2 97 %     Weight      Height      Head Circumference      Peak Flow      Pain Score 5     Pain Loc      Pain Education      Exclude from Growth Chart    No data found.  Updated Vital Signs BP 114/78 (BP Location: Left Arm)   Pulse 75   Temp 98.6 F (37 C) (Oral)   Resp 16   LMP  (LMP Unknown)   SpO2 97%   Visual Acuity Right Eye Distance:   Left Eye Distance:   Bilateral Distance:    Right Eye Near:   Left Eye Near:    Bilateral Near:     Physical Exam GEN: well appearing female in no acute distress  CVS: well perfused  RESP: speaking in full sentences without pause  GU: deferred, patient performed self swab    UC Treatments / Results  Labs (all labs ordered are listed, but only abnormal results are displayed) Labs Reviewed  URINALYSIS, W/ REFLEX TO CULTURE (INFECTION SUSPECTED) - Abnormal; Notable for the following components:      Result Value   Hgb urine dipstick SMALL (*)    Leukocytes,Ua TRACE (*)    Bacteria, UA MANY (*)    All other components within normal limits  URINE CULTURE  CERVICOVAGINAL ANCILLARY ONLY    EKG   Radiology No results found.  Procedures Procedures (including critical care time)  Medications Ordered in UC Medications - No data to display  Initial  Impression / Assessment and Plan / UC Course  I have reviewed the triage vital signs and the nursing notes.  Pertinent labs & imaging results that were available during my care of the patient were reviewed by me and considered in my medical decision making (see chart for  details).      Patient is a 48 y.o.Amanda Miranda female  who presents for urinary symptoms with GU odor.  Overall patient is well-appearing and afebrile.  Vital signs stable.  Urinalysis with mildly consistent with acute cystitis.   Hematuria not supported on microscopy.  Treat with Macrobid  2 times Miranda for 5 days. Urine culture obtained.  Follow-up sensitivities and stop/change antibiotics, if needed.  Vaginal swab for yeast vaginitis and bacterial vaginitis obtained.  - Treatment:Macrobid  twice Miranda for 5 days.  Diflucan  for 2 doses for  yeast infection    Return precautions including abdominal pain, fever, chills, nausea, or vomiting given. Discussed MDM, treatment plan and plan for follow-up with patient who agrees with plan.        Final Clinical Impressions(s) / UC Diagnoses   Final diagnoses:  Abnormal body odor  Yeast infection  Dysuria     Discharge Instructions      Stop by the pharmacy to pick up your prescriptions.  Follow up with your primary care provider or return to the urgent care, if not improving.   I sent your urine for culture.  Someone may call you to stop or change antibiotics based off your culture results.   Your vaginal test results will be available in the next 72 hours. If positive, someone will contact you.  You should see your results in your MyChart account.     ED Prescriptions     Medication Sig Dispense Auth. Provider   nitrofurantoin , macrocrystal-monohydrate, (MACROBID ) 100 MG capsule Take 1 capsule (100 mg total) by mouth 2 (two) times Miranda. 10 capsule Yarel Rushlow, DO   fluconazole  (DIFLUCAN ) 150 MG tablet Take 1 tablet (150 mg total) by mouth once a week for 2 doses. 2 tablet  Fidel Huddle, DO      PDMP not reviewed this encounter.   Fidel Huddle, DO 02/25/24 1708    Fidel Huddle, DO 02/25/24 1708

## 2024-02-25 NOTE — ED Triage Notes (Signed)
 Urinary frequency, burning with urination, lower back pain, strong odor when urinate x 1 month. Taking ibuprofen and aleve.

## 2024-02-26 LAB — CERVICOVAGINAL ANCILLARY ONLY
Bacterial Vaginitis (gardnerella): NEGATIVE
Candida Glabrata: NEGATIVE
Candida Vaginitis: NEGATIVE
Comment: NEGATIVE
Comment: NEGATIVE
Comment: NEGATIVE

## 2024-02-27 ENCOUNTER — Ambulatory Visit (HOSPITAL_COMMUNITY): Payer: Self-pay

## 2024-03-08 ENCOUNTER — Ambulatory Visit: Admitting: Dermatology

## 2024-03-08 ENCOUNTER — Encounter: Payer: Self-pay | Admitting: Dermatology

## 2024-03-08 DIAGNOSIS — L719 Rosacea, unspecified: Secondary | ICD-10-CM

## 2024-03-08 DIAGNOSIS — Z1283 Encounter for screening for malignant neoplasm of skin: Secondary | ICD-10-CM

## 2024-03-08 DIAGNOSIS — Z86018 Personal history of other benign neoplasm: Secondary | ICD-10-CM

## 2024-03-08 DIAGNOSIS — W908XXA Exposure to other nonionizing radiation, initial encounter: Secondary | ICD-10-CM | POA: Diagnosis not present

## 2024-03-08 DIAGNOSIS — D229 Melanocytic nevi, unspecified: Secondary | ICD-10-CM

## 2024-03-08 DIAGNOSIS — L814 Other melanin hyperpigmentation: Secondary | ICD-10-CM | POA: Diagnosis not present

## 2024-03-08 DIAGNOSIS — L578 Other skin changes due to chronic exposure to nonionizing radiation: Secondary | ICD-10-CM | POA: Diagnosis not present

## 2024-03-08 DIAGNOSIS — L820 Inflamed seborrheic keratosis: Secondary | ICD-10-CM

## 2024-03-08 DIAGNOSIS — Z79899 Other long term (current) drug therapy: Secondary | ICD-10-CM

## 2024-03-08 DIAGNOSIS — Z7189 Other specified counseling: Secondary | ICD-10-CM

## 2024-03-08 DIAGNOSIS — D1801 Hemangioma of skin and subcutaneous tissue: Secondary | ICD-10-CM

## 2024-03-08 DIAGNOSIS — L821 Other seborrheic keratosis: Secondary | ICD-10-CM

## 2024-03-08 MED ORDER — IVERMECTIN 1 % EX CREA
TOPICAL_CREAM | CUTANEOUS | 11 refills | Status: DC
Start: 2024-03-08 — End: 2024-05-08

## 2024-03-08 NOTE — Patient Instructions (Addendum)

## 2024-03-08 NOTE — Progress Notes (Unsigned)
 Follow-Up Visit   Subjective  Amanda Miranda is a 48 y.o. female who presents for the following: Skin Cancer Screening and Full Body Skin Exam. Hx of dysplastic nevus. No personal Hx of skin cancer.   The patient presents for Total-Body Skin Exam (TBSE) for skin cancer screening and mole check. The patient has spots, moles and lesions to be evaluated, some may be new or changing and the patient may have concern these could be cancer.  The following portions of the chart were reviewed this encounter and updated as appropriate: medications, allergies, medical history  Review of Systems:  No other skin or systemic complaints except as noted in HPI or Assessment and Plan.  Objective  Well appearing patient in no apparent distress; mood and affect are within normal limits.  A full examination was performed including scalp, head, eyes, ears, nose, lips, neck, chest, axillae, abdomen, back, buttocks, bilateral upper extremities, bilateral lower extremities, hands, feet, fingers, toes, fingernails, and toenails. All findings within normal limits unless otherwise noted below.   Relevant physical exam findings are noted in the Assessment and Plan.  upper back x14, L lat breast x1, L ant shoulder x1, L ant waistline x1, R lat epigastric x1, R face x5, R thigh x1, L cheek x1, post legs x3 (28) Erythematous keratotic or waxy stuck-on papule or plaque.  Assessment & Plan   SKIN CANCER SCREENING PERFORMED TODAY.  HISTORY OF DYSPLASTIC NEVUS. Left lower back paraspinal, mild atypia. 01/31/2021 No evidence of recurrence today Recommend regular full body skin exams Recommend daily broad spectrum sunscreen SPF 30+ to sun-exposed areas, reapply every 2 hours as needed.  Call if any new or changing lesions are noted between office visits   ACTINIC DAMAGE - Chronic condition, secondary to cumulative UV/sun exposure - diffuse scaly erythematous macules with underlying dyspigmentation - Recommend daily  broad spectrum sunscreen SPF 30+ to sun-exposed areas, reapply every 2 hours as needed.  - Staying in the shade or wearing long sleeves, sun glasses (UVA+UVB protection) and wide brim hats (4-inch brim around the entire circumference of the hat) are also recommended for sun protection.  - Call for new or changing lesions.  LENTIGINES, SEBORRHEIC KERATOSES, HEMANGIOMAS - Benign normal skin lesions - Benign-appearing - Call for any changes  MELANOCYTIC NEVI - Tan-brown and/or pink-flesh-colored symmetric macules and papules - Benign appearing on exam today - Observation - Call clinic for new or changing moles - Recommend daily use of broad spectrum spf 30+ sunscreen to sun-exposed areas.    ROSACEA Exam Mid face erythema with telangiectasias  Chronic and persistent condition with duration or expected duration over one year. Condition is improving with treatment but not currently at goal. Rosacea is a chronic progressive skin condition usually affecting the face of adults, causing redness and/or acne bumps. It is treatable but not curable. It sometimes affects the eyes (ocular rosacea) as well. It may respond to topical and/or systemic medication and can flare with stress, sun exposure, alcohol, exercise, topical steroids (including hydrocortisone/cortisone 10) and some foods.  Daily application of broad spectrum spf 30+ sunscreen to face is recommended to reduce flares.  Patient denies grittiness of the eyes  Treatment Plan Continue Rosacea Triple cream as directed  INFLAMED SEBORRHEIC KERATOSIS (28) upper back x14, L lat breast x1, L ant shoulder x1, L ant waistline x1, R lat epigastric x1, R face x5, R thigh x1, L cheek x1, post legs x3 (28) Symptomatic, irritating, patient would like treated. Destruction of lesion -  upper back x14, L lat breast x1, L ant shoulder x1, L ant waistline x1, R lat epigastric x1, R face x5, R thigh x1, L cheek x1, post legs x3 (28) Complexity: simple    Destruction method: cryotherapy   Informed consent: discussed and consent obtained   Timeout:  patient name, date of birth, surgical site, and procedure verified Lesion destroyed using liquid nitrogen: Yes   Region frozen until ice ball extended beyond lesion: Yes   Outcome: patient tolerated procedure well with no complications   Post-procedure details: wound care instructions given    Return in about 1 year (around 03/08/2025) for TBSE, HxDN.  I, Jill Parcell, CMA, am acting as scribe for Alm Rhyme, MD.   Documentation: I have reviewed the above documentation for accuracy and completeness, and I agree with the above.  Alm Rhyme, MD

## 2024-03-09 ENCOUNTER — Encounter: Payer: Self-pay | Admitting: Dermatology

## 2024-03-25 ENCOUNTER — Other Ambulatory Visit: Payer: Self-pay

## 2024-03-25 DIAGNOSIS — G43119 Migraine with aura, intractable, without status migrainosus: Secondary | ICD-10-CM

## 2024-04-08 ENCOUNTER — Ambulatory Visit
Admission: RE | Admit: 2024-04-08 | Discharge: 2024-04-08 | Disposition: A | Discharge status: Home / Self Care | Source: Ambulatory Visit

## 2024-04-08 DIAGNOSIS — G43119 Migraine with aura, intractable, without status migrainosus: Secondary | ICD-10-CM | POA: Diagnosis present

## 2024-05-08 ENCOUNTER — Encounter: Payer: Self-pay | Admitting: Emergency Medicine

## 2024-05-08 ENCOUNTER — Ambulatory Visit
Admission: EM | Admit: 2024-05-08 | Discharge: 2024-05-08 | Disposition: A | Discharge status: Home / Self Care | Attending: Emergency Medicine | Admitting: Emergency Medicine

## 2024-05-08 DIAGNOSIS — H6993 Unspecified Eustachian tube disorder, bilateral: Secondary | ICD-10-CM | POA: Insufficient documentation

## 2024-05-08 DIAGNOSIS — N390 Urinary tract infection, site not specified: Secondary | ICD-10-CM | POA: Insufficient documentation

## 2024-05-08 LAB — URINALYSIS, W/ REFLEX TO CULTURE (INFECTION SUSPECTED)
Bilirubin Urine: NEGATIVE
Glucose, UA: NEGATIVE mg/dL
Ketones, ur: NEGATIVE mg/dL
Nitrite: NEGATIVE
Protein, ur: NEGATIVE mg/dL
Specific Gravity, Urine: 1.02 (ref 1.005–1.030)
WBC, UA: >50 WBC/hpf (ref 0–5)
pH: 6.5 (ref 5.0–8.0)

## 2024-05-08 MED ORDER — IPRATROPIUM BROMIDE 0.06 % NA SOLN: 2.0000 | Freq: Four times a day (QID) | NASAL | 12 refills | Status: AC

## 2024-05-08 MED ORDER — METHYLPREDNISOLONE 4 MG PO TBPK: ORAL_TABLET | ORAL | 0 refills | Status: AC

## 2024-05-08 MED ORDER — NITROFURANTOIN MONOHYD MACRO 100 MG PO CAPS: 100.0000 mg | ORAL_CAPSULE | Freq: Two times a day (BID) | ORAL | 0 refills | Status: AC

## 2024-05-08 MED ORDER — FLUTICASONE PROPIONATE 50 MCG/ACT NA SUSP: 2.0000 | Freq: Every day | NASAL | 1 refills | Status: AC

## 2024-05-08 MED ORDER — PHENAZOPYRIDINE HCL 200 MG PO TABS
200.0000 mg | ORAL_TABLET | Freq: Three times a day (TID) | ORAL | 0 refills | Status: AC
Start: 2024-05-08 — End: ?

## 2024-05-08 NOTE — ED Triage Notes (Signed)
 Pt c/o bilateral ear pain. She states right feels clogged up. Started yesterday.   Pt is also c/o lower back pain, urinary frequency, and odorous urine. She states this is normally her first symptoms of a UTI.

## 2024-05-08 NOTE — Discharge Instructions (Addendum)
 Take the Macrobid  twice daily for 5 days with food for treatment of urinary tract infection.  Use the Pyridium  every 8 hours as needed for urinary discomfort.  This will turn your urine a bright red-orange.  Increase your oral fluid intake so that you increase your urine production and or flushing your urinary system.  Take an over-the-counter probiotic, such as Culturelle-Align-Activia, 1 hour after each dose of antibiotic to prevent diarrhea or yeast infections from forming.  We will culture urine and change the antibiotics if necessary.  Use the Atrovent  nasal spray, 2 squirts in each nostril every 6 hours, to help with nasal congestion.  Take over-the-counter Zyrtec, Claritin, or Allegra once daily to help with allergic symptoms.  Instill 2 squirts of fluticasone  in each nostril at bedtime nightly.  And the nasal away from the septum of your nose and follow each set of squirts with 1 squirt of nasal saline to push the particles up into your turbinates where they will take effect.  Continue to equalize your ears as shown to help clear mucus from eustachian tubes and maintain patency.  Starting tomorrow morning take the Medrol  Dosepak according to the package instructions.  This will decrease inflammation in your eustachian tubes which should return patency.  You may also limit consider placing a heating pad underneath your pillowcase and sending it out low.  When you lay down at night the heat can help dilate your eustachian tube and help resolve your symptoms.

## 2024-05-08 NOTE — ED Provider Notes (Signed)
 [facility]    CSN: [id number] Arrival date & time: 05/08/24  1304      History   Chief Complaint Chief Complaint  Patient presents with   Otalgia    bilateral   Urinary Frequency    HPI Amanda Miranda is a 48 y.o. female.   HPI  48 year old female with past medical history significant for IBS, GERD, and migraine headaches presents for evaluation of 2 separate complaints.  The first complaint is that she has had pain in both of her ears that started yesterday.  She reports that her right ear is completely clogged and her left ear is clogged intermittently.  No associated fever or upper respiratory symptoms.  She is also experiencing urinary urgency, frequency, odorous urine, and low back pain.  She denies any pain with urination or blood in her urine.  She reports that these are typically the first symptoms she gets when she is developing a UTI.  Past Medical History:  Diagnosis Date   Atypical mole 01/31/2021   left lower back paraspinal, mild atypia    IBS (irritable bowel syndrome)    Migraines     Patient Active Problem List   Diagnosis Date Noted   Colon cancer screening    Gastroesophageal reflux disease without esophagitis    Female stress incontinence 07/17/2018   Migraine 06/11/2017   Abnormal cervical Papanicolaou smear 09/16/1992    Past Surgical History:  Procedure Laterality Date   COLONOSCOPY WITH PROPOFOL  N/A 07/06/2020   Procedure: COLONOSCOPY WITH BIOPSY;  Surgeon: Unk Corinn Skiff, MD;  Location: [facility];  Service: Endoscopy;  Laterality: N/A;   ESOPHAGOGASTRODUODENOSCOPY (EGD) WITH PROPOFOL  N/A 09/03/2018   Procedure: ESOPHAGOGASTRODUODENOSCOPY (EGD) WITH PROPOFOL ;  Surgeon: Unk Corinn Skiff, MD;  Location: [facility];  Service: Gastroenterology;  Laterality: N/A;   INCONTINENCE SURGERY  04/17/2018   neck surgery     OVARIAN CYST REMOVAL      OB History   No obstetric history on file.      Home Medications     Prior to Admission medications   Medication Sig Start Date End Date Taking? Authorizing Provider  cyclobenzaprine (FLEXERIL) 5 MG tablet Take by mouth. 03/31/20  Yes [provider]  Erenumab-aooe (AIMOVIG) 140 MG/ML SOAJ Inject 1 Dose into the skin every 30 (thirty) days.   Yes [provider]  fluticasone  (FLONASE ) 50 MCG/ACT nasal spray Place 2 sprays into both nostrils daily. 05/08/24  Yes Bernardino Ditch, NP  ipratropium (ATROVENT ) 0.06 % nasal spray Place 2 sprays into both nostrils 4 (four) times daily. 05/08/24  Yes Bernardino Ditch, NP  levonorgestrel (MIRENA, 52 MG,) 20 MCG/24HR IUD Mirena 20 mcg/24 hours (5 yrs) 52 mg intrauterine device  Take by intrauterine route.   Yes [provider]  methylPREDNISolone  (MEDROL  DOSEPAK) 4 MG TBPK tablet Take according to the package insert. 05/08/24  Yes Bernardino Ditch, NP  nitrofurantoin , macrocrystal-monohydrate, (MACROBID ) 100 MG capsule Take 1 capsule (100 mg total) by mouth 2 (two) times daily. 05/08/24  Yes Bernardino Ditch, NP  phenazopyridine  (PYRIDIUM ) 200 MG tablet Take 1 tablet (200 mg total) by mouth 3 (three) times daily. 05/08/24  Yes Bernardino Ditch, NP  EPINEPHrine  0.3 mg/0.3 mL IJ SOAJ injection Inject into the skin. 05/25/18   [provider]  Glycopyrronium Tosylate  (QBREXZA ) 2.4 % PADS Apply to axillary area once a day 02/06/23   Hester Alm BROCKS, MD  ondansetron (ZOFRAN-ODT) 4 MG disintegrating tablet Take by mouth. 03/23/19   [provider]  Sod Fluoride-Potassium Nitrate (PREVIDENT 5000 SENSITIVE) 1.1-5 % PSTE Prevident 5000 Enamel Protect 1.1 %-5 % dental paste    [provider]    Family History Family History  Problem Relation Age of Onset   Hypertension Mother    COPD Father    Breast cancer Sister     Social History Social History   Tobacco Use   Smoking status: Never   Smokeless tobacco: Never  Vaping Use   Vaping status: Never Used  Substance Use Topics   Alcohol use: Yes     Comment: rarely   Drug use: No     Allergies   Secnidazole, Sumatriptan succinate, and Zolmitriptan   Review of Systems Review of Systems  Constitutional:  Negative for fever.  HENT:  Positive for ear pain and hearing loss. Negative for congestion, ear discharge, rhinorrhea and sore throat.   Respiratory:  Negative for cough.   Genitourinary:  Positive for frequency and urgency. Negative for dysuria and hematuria.  Musculoskeletal:  Positive for back pain.     Physical Exam Triage Vital Signs ED Triage Vitals  Encounter Vitals Group     BP      Girls Systolic BP Percentile      Girls Diastolic BP Percentile      Boys Systolic BP Percentile      Boys Diastolic BP Percentile      Pulse      Resp      Temp      Temp src      SpO2      Weight      Height      Head Circumference      Peak Flow      Pain Score      Pain Loc      Pain Education      Exclude from Growth Chart    No data found.  Updated Vital Signs BP 123/83 (BP Location: Right Arm)   Pulse 75   Temp 98.6 F (37 C) (Oral)   Resp 16   Ht 5' 6 (1.676 m)   Wt 134 lb 0.6 oz (60.8 kg)   SpO2 98%   BMI 21.63 kg/m   Visual Acuity Right Eye Distance:   Left Eye Distance:   Bilateral Distance:    Right Eye Near:   Left Eye Near:    Bilateral Near:     Physical Exam Vitals and nursing note reviewed.  Constitutional:      Appearance: Normal appearance. She is not ill-appearing.  HENT:     Head: Normocephalic and atraumatic.     Right Ear: Tympanic membrane, ear canal and external ear normal. There is no impacted cerumen.     Left Ear: Tympanic membrane, ear canal and external ear normal. There is no impacted cerumen.     Ears:     Comments: Tenderness to external palpation of bilateral eustachian tubes. Cardiovascular:     Rate and Rhythm: Normal rate and regular rhythm.     Pulses: Normal pulses.     Heart sounds: Normal heart sounds. No murmur heard.    No friction rub. No gallop.   Pulmonary:     Effort: Pulmonary effort is normal.     Breath sounds: Normal breath sounds. No wheezing, rhonchi or rales.  Abdominal:     Tenderness: There is no right CVA tenderness or left CVA tenderness.  Skin:    General: Skin is warm and dry.  Capillary Refill: Capillary refill takes less than 2 seconds.     Findings: No rash.  Neurological:     General: No focal deficit present.     Mental Status: She is alert and oriented to person, place, and time.      UC Treatments / Results  Labs (all labs ordered are listed, but only abnormal results are displayed) Labs Reviewed  URINALYSIS, W/ REFLEX TO CULTURE (INFECTION SUSPECTED) - Abnormal; Notable for the following components:      Result Value   Hgb urine dipstick TRACE (*)    Leukocytes,Ua MODERATE (*)    Bacteria, UA FEW (*)    All other components within normal limits  URINE CULTURE    EKG   Radiology No results found.  Procedures Procedures (including critical care time)  Medications Ordered in UC Medications - No data to display  Initial Impression / Assessment and Plan / UC Course  I have reviewed the triage vital signs and the nursing notes.  Pertinent labs & imaging results that were available during my care of the patient were reviewed by me and considered in my medical decision making (see chart for details).   Patient is a very pleasant, nontoxic-appearing 48 year old female presenting for evaluation otogenic and genitourinary symptoms.  With regards to her other agenic symptoms she is can concerned because she feels like her right ear is completely plugged up and her left ear gets plugged intermittently.  She reports yesterday she was able to clear her ears using the Valsalva maneuver but she is unable to do so today.  On exam, the patient has clear external auditory canals bilaterally and both of her tympanic membranes are pearly gray in appearance.  She does have tenderness to palpation of her  eustachian tubes externally.  I suspect that she has eustachian tube dysfunction.  I will discharge her home on Atrovent  nasal spray, Flonase , and a Medrol  Dosepak.  With regards to her genitourinary symptoms she is experiencing urgency, frequency, malodorous urine, and low back pain.  These typically are precursors to development of a UTI.  Her symptoms began today.  She has no CVA tenderness on exam.  I will order urinalysis to assess for the presence of UTI.  Urinalysis shows trace hemoglobin with moderate leukocyte esterase.  Negative for nitrates, protein, or glucose.  Reflex microscopy shows greater than 50 WBCs with few bacteria and WBC clumps.  Urine will reflex to culture.  I will treat patient for her UTI with Macrobid  100 mg twice daily for 5 days along with Pyridium  200 mg every 8 hours as needed for urinary discomfort.  Final Clinical Impressions(s) / UC Diagnoses   Final diagnoses:  Dysfunction of both eustachian tubes  Lower urinary tract infectious disease     Discharge Instructions      Take the Macrobid  twice daily for 5 days with food for treatment of urinary tract infection.  Use the Pyridium  every 8 hours as needed for urinary discomfort.  This will turn your urine a bright red-orange.  Increase your oral fluid intake so that you increase your urine production and or flushing your urinary system.  Take an over-the-counter probiotic, such as Culturelle-Align-Activia, 1 hour after each dose of antibiotic to prevent diarrhea or yeast infections from forming.  We will culture urine and change the antibiotics if necessary.  Use the Atrovent  nasal spray, 2 squirts in each nostril every 6 hours, to help with nasal congestion.  Take over-the-counter Zyrtec, Claritin, or Allegra once  daily to help with allergic symptoms.  Instill 2 squirts of fluticasone  in each nostril at bedtime nightly.  And the nasal away from the septum of your nose and follow each set of squirts  with 1 squirt of nasal saline to push the particles up into your turbinates where they will take effect.  Continue to equalize your ears as shown to help clear mucus from eustachian tubes and maintain patency.  Starting tomorrow morning take the Medrol  Dosepak according to the package instructions.  This will decrease inflammation in your eustachian tubes which should return patency.  You may also limit consider placing a heating pad underneath your pillowcase and sending it out low.  When you lay down at night the heat can help dilate your eustachian tube and help resolve your symptoms.       ED Prescriptions     Medication Sig Dispense Auth. Provider   ipratropium (ATROVENT ) 0.06 % nasal spray Place 2 sprays into both nostrils 4 (four) times daily. 15 mL Bernardino Ditch, NP   fluticasone  (FLONASE ) 50 MCG/ACT nasal spray Place 2 sprays into both nostrils daily. 18.2 mL Bernardino Ditch, NP   methylPREDNISolone  (MEDROL  DOSEPAK) 4 MG TBPK tablet Take according to the package insert. 1 each Bernardino Ditch, NP   nitrofurantoin , macrocrystal-monohydrate, (MACROBID ) 100 MG capsule Take 1 capsule (100 mg total) by mouth 2 (two) times daily. 10 capsule Bernardino Ditch, NP   phenazopyridine  (PYRIDIUM ) 200 MG tablet Take 1 tablet (200 mg total) by mouth 3 (three) times daily. 6 tablet Bernardino Ditch, NP      PDMP not reviewed this encounter.   Bernardino Ditch, NP 05/08/24 [phone]

## 2024-05-10 LAB — URINE CULTURE: Culture: 50000 — AB

## 2024-05-14 ENCOUNTER — Ambulatory Visit (HOSPITAL_COMMUNITY): Payer: Self-pay

## 2024-07-19 ENCOUNTER — Other Ambulatory Visit: Payer: Self-pay | Admitting: Physician Assistant

## 2024-07-19 DIAGNOSIS — R1032 Left lower quadrant pain: Secondary | ICD-10-CM

## 2024-07-20 ENCOUNTER — Inpatient Hospital Stay
Admission: RE | Admit: 2024-07-20 | Discharge: 2024-07-20 | Disposition: A | Source: Ambulatory Visit | Attending: Physician Assistant

## 2024-07-20 DIAGNOSIS — R1032 Left lower quadrant pain: Secondary | ICD-10-CM

## 2024-07-20 MED ORDER — IOPAMIDOL (ISOVUE-300) INJECTION 61%
100.0000 mL | Freq: Once | INTRAVENOUS | Status: AC | PRN
  Administered 2024-07-20: 100 mL via INTRAVENOUS

## 2025-03-08 ENCOUNTER — Ambulatory Visit: Admitting: Dermatology
